# Patient Record
Sex: Male | Born: 1975 | Race: White | Hispanic: No | Marital: Single
Health system: Midwestern US, Community
[De-identification: ages and names within clinical notes are randomized; demographics above are authoritative.]

## PROBLEM LIST (undated history)

## (undated) DIAGNOSIS — A4902 Methicillin resistant Staphylococcus aureus infection, unspecified site: Secondary | ICD-10-CM

## (undated) DIAGNOSIS — I509 Heart failure, unspecified: Secondary | ICD-10-CM

## (undated) HISTORY — PX: LAPAROSCOPIC GASTRIC BANDING: SHX1100

## (undated) HISTORY — PX: LAPAROSCOPIC REPAIR AND REMOVAL OF GASTRIC BAND: SHX5919

## (undated) HISTORY — PX: CHOLECYSTECTOMY: SHX55

---

## 2016-07-21 ENCOUNTER — Inpatient Hospital Stay
Admit: 2016-07-21 | Discharge: 2016-07-24 | Disposition: A | Discharge status: Other Acute Inpatient Hospital | Payer: MEDICARE | Attending: Emergency Medicine

## 2016-07-21 DIAGNOSIS — F329 Major depressive disorder, single episode, unspecified: Secondary | ICD-10-CM

## 2016-07-21 LAB — ETHYL ALCOHOL: ALCOHOL(ETHYL),SERUM: <3 MG/DL (ref 0–3)

## 2016-07-21 LAB — DRUG SCREEN, URINE
AMPHETAMINES: NEGATIVE
BARBITURATES: NEGATIVE
BENZODIAZEPINES: NEGATIVE
COCAINE: NEGATIVE
METHADONE: NEGATIVE
OPIATES: NEGATIVE
PCP(PHENCYCLIDINE): NEGATIVE
THC (TH-CANNABINOL): NEGATIVE

## 2016-07-21 LAB — CBC WITH AUTOMATED DIFF
ABS. BASOPHILS: 0 10*3/uL (ref 0.0–0.06)
ABS. EOSINOPHILS: 0.1 10*3/uL (ref 0.0–0.4)
ABS. LYMPHOCYTES: 2.4 10*3/uL (ref 0.9–3.6)
ABS. MONOCYTES: 0.9 10*3/uL (ref 0.05–1.2)
ABS. NEUTROPHILS: 4.2 10*3/uL (ref 1.8–8.0)
BASOPHILS: 0 % (ref 0–2)
EOSINOPHILS: 2 % (ref 0–5)
HCT: 40.8 % (ref 36.0–48.0)
HGB: 12.9 g/dL — ABNORMAL LOW (ref 13.0–16.0)
LYMPHOCYTES: 31 % (ref 21–52)
MCH: 29.3 PG (ref 24.0–34.0)
MCHC: 31.6 g/dL (ref 31.0–37.0)
MCV: 92.5 FL (ref 74.0–97.0)
MONOCYTES: 12 % — ABNORMAL HIGH (ref 3–10)
MPV: 9.8 FL (ref 9.2–11.8)
NEUTROPHILS: 55 % (ref 40–73)
PLATELET: 218 10*3/uL (ref 135–420)
RBC: 4.41 M/uL — ABNORMAL LOW (ref 4.70–5.50)
RDW: 14.7 % — ABNORMAL HIGH (ref 11.6–14.5)
WBC: 7.6 10*3/uL (ref 4.6–13.2)

## 2016-07-21 LAB — METABOLIC PANEL, BASIC
Anion gap: 10 mmol/L (ref 3.0–18)
BUN/Creatinine ratio: 13 (ref 12–20)
BUN: 10 MG/DL (ref 7.0–18)
CO2: 29 mmol/L (ref 21–32)
Calcium: 8.8 MG/DL (ref 8.5–10.1)
Chloride: 105 mmol/L (ref 100–108)
Creatinine: 0.77 MG/DL (ref 0.6–1.3)
GFR est AA: >60 mL/min/{1.73_m2} (ref >60)
GFR est non-AA: >60 mL/min/{1.73_m2} (ref >60)
Glucose: 83 mg/dL (ref 74–99)
Potassium: 3.6 mmol/L (ref 3.5–5.5)
Sodium: 144 mmol/L (ref 136–145)

## 2016-07-21 LAB — GLUCOSE, POC: Glucose (POC): 92 mg/dL (ref 70–110)

## 2016-07-21 NOTE — Consults (Signed)
Tele-psychiatry consult is done with the help of onsite staff.  Patient location: [facility])  Physician location: [place]    Patient Name: Clarence Rodriguez  Date: 07/21/2016  Time: 4:13 PM   DOB: 1975-11-21    Reason for consult:  SI  History of Present Illness: Clarence Rodriguez is a 41 y.o. male with bipolar depression and PTSD brought in by the police for suicidal ideation with plan to overdose. Patient has been increasingly depressed over the last 2 weeks. Staff report that patient is sleepy, calm, and cooperative in the hospital. On interview, patient had difficulty staying awake but was able to confirm that he is suicidal but decided to get help before following through on the thoughts to overdose. It is unclear if this sedation is a response to his depakote which has apparently been started at his most recent inpatient encounter. Patient denies HI but admits to throwing something at staff when he was last admitted. He denies any current symptoms of psychosis.    SI/HI/Self harm/Violence: current active SI with past overdose; no HI but has thrown things at staff during past hospital encounters    Sources of information: patient, EMR, hospital staff: PA Janit Bern    Psychiatric History/Treatment History: inpatient last month; no current established outpatient     Drug/Alcohol History: UDS: negative  Medical History:   History reviewed. No pertinent past medical history.     Medications & Freq:   Prior to Admission medications    Medication Sig Start Date End Date Taking? Authorizing Provider   divalproex ER (DEPAKOTE ER) 250 mg ER tablet Take  by mouth.   Yes Phys Other, MD   dilTIAZem ER (CARDIZEM LA) 240 mg Tb24 tablet Take 240 mg by mouth daily.   Yes Phys Other, MD   ibuprofen 200 mg cap Take  by mouth.   Yes Phys Other, MD   pregabalin (LYRICA) 100 mg capsule Take 100 mg by mouth two (2) times a day.   Yes Phys Other, MD   metFORMIN (GLUCOPHAGE) 500 mg tablet Take 500 mg by mouth two (2) times  daily (with meals).   Yes Phys Other, MD   montelukast (SINGULAIR) 10 mg tablet Take 10 mg by mouth daily.   Yes Phys Other, MD   cephALEXin (KEFLEX) 500 mg capsule Take 500 mg by mouth four (4) times daily.   Yes Phys Other, MD   lisinopril (PRINIVIL, ZESTRIL) 40 mg tablet Take 40 mg by mouth daily.   Yes Phys Other, MD   cloNIDine HCl (CATAPRES) 0.1 mg tablet Take  by mouth two (2) times a day.   Yes Phys Other, MD   divalproex ER (DEPAKOTE ER) 250 mg ER tablet Take 250 mg by mouth.   Yes Phys Other, MD     Allergies: No Known Allergies  Family Psych History/History of suicide: History reviewed. No pertinent family history.  Social History:    Employment: none, on disability   Living situation: homeless shelter   Stressors: recent move here from [place]   Strengths: accepts help    Mental Status Exam:   Appearance and attire: appropriate for setting  Attitude and behavior: attempts to cooperate with questions but very somnolent  Speech: slowed, slurred  Affect and mood: blunted, "depressed"  Association and thought processes: goal directed, impoverished  Thought content: SI: active plans to overdose; HI: denies  Perception: AVH: none; Delusions: none  Sensorium and orientation: somnolent, mostly oriented to situation   Insight and judgment: fair  Impression/Risk Assessment:   Clarence Rodriguez is a 41 y.o. male with depression who presents to the ED for SI with plans to overdose. Patient is sedated but denies having taken more than is indicated on his prescriptions. No HI or symptoms of psychosis.    Principal Diagnosis: F31.9 Bipolar depression    Treatment Recommendations:  1. Disposition: voluntary for inpatient psychiatry   2. Psychiatric medications: no changes in this setting     The above were discussed with the patient and the referring provider; able parties stated understanding and agreement with the recommendations.    Electronically signed by Aura FeyNelly Delphine Sizemore, M.D.

## 2016-07-21 NOTE — ED Notes (Signed)
3 belongings bag placed in communication room due to size unable to fit in locker. Security took phone and credit car to safe.

## 2016-07-21 NOTE — ED Provider Notes (Signed)
HPI Comments: Patient is a 40 y/o morbidly obese male w/ PMH Depression, HTN, DM, cellulitis who presents to the ER for evaluation of suicidal ideations.  Patient was transported in by [employer] as a voluntary admission.  Patient reports within the past week, his depression has been worsening, and now he is having thoughts of wanting to kill himself.  Patient reports his plan is to overdose on his prescribed medications.  He reports recently started taking Lyrica, and has been taking all of his medications as prescribed.  He denied any recent drug/ETOH use.  He denied any homicidal ideations, auditory or visual hallucinations, and is voluntary at this time.  No other complaints.    Patient is a 41 y.o. male presenting with suicidal ideation. The history is provided by the patient.   Suicidal   Pertinent negatives include no shortness of breath, no chest pain, no vomiting, no headaches and no nausea.        History reviewed. No pertinent past medical history.    History reviewed. No pertinent surgical history.      History reviewed. No pertinent family history.    Social History     Social History   ??? Marital status: SINGLE     Spouse name: N/A   ??? Number of children: N/A   ??? Years of education: N/A     Occupational History   ??? Not on file.     Social History Main Topics   ??? Smoking status: Current Every Day Smoker   ??? Smokeless tobacco: Never Used   ??? Alcohol use No   ??? Drug use: Not on file   ??? Sexual activity: Not on file     Other Topics Concern   ??? Not on file     Social History Narrative   ??? No narrative on file         ALLERGIES: Review of patient's allergies indicates no known allergies.    Review of Systems   Constitutional: Negative for chills, fatigue and fever.   HENT: Negative.  Negative for sore throat.    Eyes: Negative.    Respiratory: Negative for cough and shortness of breath.    Cardiovascular: Negative for chest pain and palpitations.    Gastrointestinal: Negative for abdominal pain, nausea and vomiting.   Genitourinary: Negative for dysuria.   Musculoskeletal: Negative.    Skin: Negative.    Neurological: Negative for dizziness, weakness, light-headedness and headaches.   Psychiatric/Behavioral: Positive for suicidal ideas.   All other systems reviewed and are negative.      Vitals:    07/21/16 1442 07/21/16 1507 07/21/16 2131 07/22/16 0018   BP:  130/82 137/70 129/71   Pulse:  (!) 102 90 100   Resp:  22 21 22    Temp:  98.4 ??F (36.9 ??C) 97.7 ??F (36.5 ??C) 97.7 ??F (36.5 ??C)   SpO2:  97% 98% 98%   Weight: (!) 179.6 kg (396 lb)      Height: 5' 11"  (1.803 m)               Physical Exam   Constitutional: He is oriented to person, place, and time. He appears well-developed and well-nourished. No distress.   Morbidly obese, sleepy but easily aroused by voice   HENT:   Head: Normocephalic and atraumatic.   Mouth/Throat: Oropharynx is clear and moist.   Eyes: Conjunctivae are normal. No scleral icterus.   Neck: Neck supple. No JVD present. No tracheal deviation present.   Cardiovascular:  Regular rhythm and normal heart sounds.  Tachycardia present.    Pulmonary/Chest: Effort normal and breath sounds normal. No respiratory distress. He has no wheezes.   Abdominal: Soft. There is no tenderness.   Musculoskeletal: Normal range of motion.   Neurological: He is alert and oriented to person, place, and time. He has normal strength. Gait normal. GCS eye subscore is 4. GCS verbal subscore is 5. GCS motor subscore is 6.   Skin: Skin is warm and dry. He is not diaphoretic.   Psychiatric: Judgment normal. His speech is slurred. He is not agitated and not aggressive. Thought content is not paranoid and not delusional. Cognition and memory are normal. He exhibits a depressed mood. He expresses suicidal ideation. He expresses no homicidal ideation. He expresses suicidal plans. He expresses no homicidal plans.    States having SI, plan is to overdose by taking too much of his medications   Nursing note and vitals reviewed.       MDM  Number of Diagnoses or Management Options  Depression, unspecified depression type:   Suicidal ideations:   Diagnosis management comments: 3:46 PM  41 y/o male brought in the ER for SI by [employer] as voluntary patient.  History of depression in the past.  States taking his meds as prescribed.  Reports that within the past 1-2 weeks worsening depression, and suicidal ideations.  Plan is to take too many of his medications to end his life.  Pt denied any drugs/ETOH on exam.  Will plan on labs, UDS and telepsych eval.  Randolm Idol, PA-C    4:40 PM  Per telepsych, recommends inpatient stabilization.  Hold all medications due to pts somnolence.  Called and spoke with Butch Penny, at [facility] behavioral.  She will add the pt to their list and evaluate pts situation.  Randolm Idol, PA-C    8:34 PM  Butch Penny from [facility] called back regarding pt.  States they cannot accept his admission at this time.  Patient noted to be almost 400 lbs, and their beds in psych can only hold patients up to 350 pounds.  Bariatric beds do not fit through the doorways at their facility.  Patient will need to stay here tonight and await further placement tomorrow morning by staff.  Dr. Veneta Penton made aware of situation.  Randolm Idol, PA-C    2:07 AM : Pt care transferred to Dr. Jetty Duhamel provider. History of patient complaint(s), available diagnostic reports and current treatment plan has been discussed thoroughly.   Intended disposition of patient : Pending placement for voluntary admission of SI  Pending diagnostics reports and/or labs (please list): None  Randolm Idol, PA-C            Clinical Impression:  Suicidal Ideations, depression       Amount and/or Complexity of Data Reviewed  Clinical lab tests: ordered and reviewed    Risk of Complications, Morbidity, and/or Mortality  Presenting problems: moderate   Diagnostic procedures: moderate  Management options: moderate    Patient Progress  Patient progress: stable        ED Course       Procedures    I am the first and primary provider of this patient.  Randolm Idol, PA-C         Vitals:  Patient Vitals for the past 12 hrs:   Temp Pulse Resp BP SpO2   07/22/16 0018 97.7 ??F (36.5 ??C) 100 22 129/71 98 %   07/21/16 2131 97.7 ??F (  36.5 ??C) 90 21 137/70 98 %   07/21/16 1507 98.4 ??F (36.9 ??C) (!) 102 22 130/82 97 %         Medications ordered:   Medications   ibuprofen (MOTRIN) tablet 800 mg (800 mg Oral Given 07/22/16 0028)         Lab findings:  Recent Results (from the past 12 hour(s))   DRUG SCREEN, URINE    Collection Time: 07/21/16  3:20 PM   Result Value Ref Range    BENZODIAZEPINES NEGATIVE  NEG      BARBITURATES NEGATIVE  NEG      THC (TH-CANNABINOL) NEGATIVE  NEG      OPIATES NEGATIVE  NEG      PCP(PHENCYCLIDINE) NEGATIVE  NEG      COCAINE NEGATIVE  NEG      AMPHETAMINES NEGATIVE  NEG      METHADONE NEGATIVE  NEG      HDSCOM (NOTE)    ETHYL ALCOHOL    Collection Time: 07/21/16  3:30 PM   Result Value Ref Range    ALCOHOL(ETHYL),SERUM <3 0 - 3 MG/DL   CBC WITH AUTOMATED DIFF    Collection Time: 07/21/16  3:30 PM   Result Value Ref Range    WBC 7.6 4.6 - 13.2 K/uL    RBC 4.41 (L) 4.70 - 5.50 M/uL    HGB 12.9 (L) 13.0 - 16.0 g/dL    HCT 40.8 36.0 - 48.0 %    MCV 92.5 74.0 - 97.0 FL    MCH 29.3 24.0 - 34.0 PG    MCHC 31.6 31.0 - 37.0 g/dL    RDW 14.7 (H) 11.6 - 14.5 %    PLATELET 218 135 - 420 K/uL    MPV 9.8 9.2 - 11.8 FL    NEUTROPHILS 55 40 - 73 %    LYMPHOCYTES 31 21 - 52 %    MONOCYTES 12 (H) 3 - 10 %    EOSINOPHILS 2 0 - 5 %    BASOPHILS 0 0 - 2 %    ABS. NEUTROPHILS 4.2 1.8 - 8.0 K/UL    ABS. LYMPHOCYTES 2.4 0.9 - 3.6 K/UL    ABS. MONOCYTES 0.9 0.05 - 1.2 K/UL    ABS. EOSINOPHILS 0.1 0.0 - 0.4 K/UL    ABS. BASOPHILS 0.0 0.0 - 0.06 K/UL    DF AUTOMATED     METABOLIC PANEL, BASIC    Collection Time: 07/21/16  3:30 PM   Result Value Ref Range     Sodium 144 136 - 145 mmol/L    Potassium 3.6 3.5 - 5.5 mmol/L    Chloride 105 100 - 108 mmol/L    CO2 29 21 - 32 mmol/L    Anion gap 10 3.0 - 18 mmol/L    Glucose 83 74 - 99 mg/dL    BUN 10 7.0 - 18 MG/DL    Creatinine 0.77 0.6 - 1.3 MG/DL    BUN/Creatinine ratio 13 12 - 20      GFR est AA >60 >60 ml/min/1.52m    GFR est non-AA >60 >60 ml/min/1.755m   Calcium 8.8 8.5 - 10.1 MG/DL   GLUCOSE, POC    Collection Time: 07/21/16  3:39 PM   Result Value Ref Range    Glucose (POC) 92 70 - 110 mg/dL       EKG interpretation by ED Physician:      X-Ray, CT or other radiology findings or impressions:  No orders to display  Progress notes, Consult notes or additional Procedure notes:       Reevaluation of patient:       Disposition:  Diagnosis:   1. Suicidal ideations    2. Depression, unspecified depression type        Disposition: Pending voluntary admission for SI    Follow-up Information     None           Patient's Medications   Start Taking    No medications on file   Continue Taking    CEPHALEXIN (KEFLEX) 500 MG CAPSULE    Take 500 mg by mouth four (4) times daily.    CLONIDINE HCL (CATAPRES) 0.1 MG TABLET    Take  by mouth two (2) times a day.    DILTIAZEM ER (CARDIZEM LA) 240 MG TB24 TABLET    Take 240 mg by mouth daily.    DIVALPROEX ER (DEPAKOTE ER) 250 MG ER TABLET    Take  by mouth.    DIVALPROEX ER (DEPAKOTE ER) 250 MG ER TABLET    Take 250 mg by mouth.    IBUPROFEN 200 MG CAP    Take  by mouth.    LISINOPRIL (PRINIVIL, ZESTRIL) 40 MG TABLET    Take 40 mg by mouth daily.    METFORMIN (GLUCOPHAGE) 500 MG TABLET    Take 500 mg by mouth two (2) times daily (with meals).    MONTELUKAST (SINGULAIR) 10 MG TABLET    Take 10 mg by mouth daily.    PREGABALIN (LYRICA) 100 MG CAPSULE    Take 100 mg by mouth two (2) times a day.   These Medications have changed    No medications on file   Stop Taking    No medications on file

## 2016-07-21 NOTE — ED Triage Notes (Signed)
Pt brought by [employer], c/o suicidal.

## 2016-07-21 NOTE — ED Notes (Signed)
Sitter at patients bedside completing q 15 minute checks on paper form.

## 2016-07-21 NOTE — Consults (Signed)
Tele-psychiatry consult is done with the help of onsite staff.  [facility]  [place]    Patient Name: Clarence Rodriguez  Date: 07/21/2016  Time: 4:13 PM   DOB: Dec 21, 1975    Reason for consult:  SI  History of Present Illness: Clarence Rodriguez is a 41 y.o. male with bipolar depression and PTSD brought in by the police for suicidal ideation with plan to overdose. Patient has been increasingly depressed over the last 2 weeks. Staff report that patient is sleepy, calm, and cooperative in the hospital. On interview, patient had difficulty staying awake but was able to confirm that he is suicidal but decided to get help before following through on the thoughts to overdose. It is unclear if this sedation is a response to his depakote which has apparently been started at his most recent inpatient encounter. Patient denies HI but admits to throwing something at staff when he was last admitted. He denies any current symptoms of psychosis.    SI/HI/Self harm/Violence: current active SI with past overdose; no HI but has thrown things at staff during past hospital encounters    Sources of information: patient, EMR, hospital staff: PA Janit BernStacy Forbes    Psychiatric History/Treatment History: inpatient last month; no current established outpatient     Drug/Alcohol History: UDS: negative  Medical History:   History reviewed. No pertinent past medical history.     Medications & Freq:   Prior to Admission medications    Medication Sig Start Date End Date Taking? Authorizing Provider   divalproex ER (DEPAKOTE ER) 250 mg ER tablet Take  by mouth.   Yes Phys Other, MD   dilTIAZem ER (CARDIZEM LA) 240 mg Tb24 tablet Take 240 mg by mouth daily.   Yes Phys Other, MD   ibuprofen 200 mg cap Take  by mouth.   Yes Phys Other, MD   pregabalin (LYRICA) 100 mg capsule Take 100 mg by mouth two (2) times a day.   Yes Phys Other, MD   metFORMIN (GLUCOPHAGE) 500 mg tablet Take 500 mg by mouth two (2) times daily (with  meals).   Yes Phys Other, MD   montelukast (SINGULAIR) 10 mg tablet Take 10 mg by mouth daily.   Yes Phys Other, MD   cephALEXin (KEFLEX) 500 mg capsule Take 500 mg by mouth four (4) times daily.   Yes Phys Other, MD   lisinopril (PRINIVIL, ZESTRIL) 40 mg tablet Take 40 mg by mouth daily.   Yes Phys Other, MD   cloNIDine HCl (CATAPRES) 0.1 mg tablet Take  by mouth two (2) times a day.   Yes Phys Other, MD   divalproex ER (DEPAKOTE ER) 250 mg ER tablet Take 250 mg by mouth.   Yes Phys Other, MD     Allergies: No Known Allergies  Family Psych History/History of suicide: History reviewed. No pertinent family history.  Social History:    Employment: none, on disability   Living situation: homeless shelter   Stressors: recent move here from [place]   Strengths: accepts help    Mental Status Exam:   Appearance and attire: appropriate for setting  Attitude and behavior: attempts to cooperate with questions but very somnolent  Speech: slowed, slurred  Affect and mood: blunted, "depressed"  Association and thought processes: goal directed, impoverished  Thought content: SI: active plans to overdose; HI: denies  Perception: AVH: none; Delusions: none  Sensorium and orientation: somnolent, mostly oriented to situation   Insight and judgment: fair  Impression/Risk Assessment:   Clarence Rodriguez is a 41 y.o. male with depression who presents to the ED for SI with plans to overdose. Patient is sedated but denies having taken more than is indicated on his prescriptions. No HI or symptoms of psychosis.    Principal Diagnosis: F31.9 Bipolar depression    Treatment Recommendations:  1. Disposition: voluntary for inpatient psychiatry   2. Psychiatric medications: no changes in this setting     The above were discussed with the patient and the referring provider; able parties stated understanding and agreement with the recommendations.    Electronically signed by Aura FeyNelly Delphine Sizemore, M.D.

## 2016-07-21 NOTE — ED Notes (Signed)
Patient place in gown, unable to fit in paper scrubs.

## 2016-07-21 NOTE — ED Notes (Signed)
Report given to Joyce Hoven, RN.

## 2016-07-22 LAB — GLUCOSE, POC: Glucose (POC): 95 mg/dL (ref 70–110)

## 2016-07-22 MED ORDER — DIPHENHYDRAMINE 50 MG CAP
50 mg | ORAL | Status: AC
Start: 2016-07-22 — End: 2016-07-22
  Administered 2016-07-22: 21:00:00 via ORAL

## 2016-07-22 MED ORDER — IBUPROFEN 400 MG TAB
400 mg | ORAL | Status: AC
Start: 2016-07-22 — End: 2016-07-22
  Administered 2016-07-22: 05:00:00 via ORAL

## 2016-07-22 MED ORDER — METFORMIN 500 MG TAB
500 mg | Freq: Two times a day (BID) | ORAL | Status: DC
Start: 2016-07-22 — End: 2016-07-24
  Administered 2016-07-22 – 2016-07-24 (×5): via ORAL

## 2016-07-22 MED ORDER — DIPHENHYDRAMINE HCL 50 MG/ML IJ SOLN
50 mg/mL | Freq: Once | INTRAMUSCULAR | Status: DC
Start: 2016-07-22 — End: 2016-07-22

## 2016-07-22 MED ORDER — WATER FOR INJECTION, STERILE INJECTION
20. mg/mL (final conc.) | Freq: Once | INTRAMUSCULAR | Status: AC
Start: 2016-07-22 — End: 2016-07-23

## 2016-07-22 MED ORDER — DILTIAZEM ER 240 MG 24 HR CAP
240 mg | Freq: Every day | ORAL | Status: DC
Start: 2016-07-22 — End: 2016-07-24
  Administered 2016-07-22 – 2016-07-24 (×3): via ORAL

## 2016-07-22 MED ORDER — CLONIDINE 0.1 MG TAB
0.1 mg | Freq: Two times a day (BID) | ORAL | Status: DC
Start: 2016-07-22 — End: 2016-07-24
  Administered 2016-07-22 – 2016-07-24 (×5): via ORAL

## 2016-07-22 MED ORDER — DIPHENHYDRAMINE 50 MG CAP
50 mg | ORAL | Status: DC
Start: 2016-07-22 — End: 2016-07-22

## 2016-07-22 MED ORDER — LISINOPRIL 20 MG TAB
20 mg | ORAL | Status: AC
Start: 2016-07-22 — End: 2016-07-22
  Administered 2016-07-22: 17:00:00 via ORAL

## 2016-07-22 MED ORDER — CEPHALEXIN 250 MG CAP
250 mg | Freq: Four times a day (QID) | ORAL | Status: DC
Start: 2016-07-22 — End: 2016-07-24
  Administered 2016-07-22 – 2016-07-24 (×7): via ORAL

## 2016-07-22 MED ORDER — DIVALPROEX  250 MG 24 HR TAB
250 mg | Freq: Every day | ORAL | Status: DC
Start: 2016-07-22 — End: 2016-07-24
  Administered 2016-07-22 – 2016-07-24 (×3): via ORAL

## 2016-07-22 MED ORDER — PREGABALIN 50 MG CAP
50 mg | Freq: Two times a day (BID) | ORAL | Status: DC
Start: 2016-07-22 — End: 2016-07-24
  Administered 2016-07-22 – 2016-07-24 (×5): via ORAL

## 2016-07-22 MED FILL — CLONIDINE 0.1 MG TAB: 0.1 mg | ORAL | Qty: 1

## 2016-07-22 MED FILL — LYRICA 50 MG CAPSULE: 50 mg | ORAL | Qty: 2

## 2016-07-22 MED FILL — DILTIAZEM ER 240 MG 24 HR CAP: 240 mg | ORAL | Qty: 1

## 2016-07-22 MED FILL — DIPHENHYDRAMINE HCL 50 MG/ML IJ SOLN: 50 mg/mL | INTRAMUSCULAR | Qty: 1

## 2016-07-22 MED FILL — METFORMIN 500 MG TAB: 500 mg | ORAL | Qty: 1

## 2016-07-22 MED FILL — CEPHALEXIN 250 MG CAP: 250 mg | ORAL | Qty: 2

## 2016-07-22 MED FILL — LISINOPRIL 20 MG TAB: 20 mg | ORAL | Qty: 2

## 2016-07-22 MED FILL — DIPHENHYDRAMINE 50 MG CAP: 50 mg | ORAL | Qty: 1

## 2016-07-22 MED FILL — GEODON 20 MG/ML (FINAL CONCENTRATION) INTRAMUSCULAR SOLUTION: 20 mg/mL (final conc.) | INTRAMUSCULAR | Qty: 20

## 2016-07-22 MED FILL — IBUPROFEN 400 MG TAB: 400 mg | ORAL | Qty: 2

## 2016-07-22 MED FILL — DIVALPROEX  250 MG 24 HR TAB: 250 mg | ORAL | Qty: 1

## 2016-07-22 NOTE — ED Notes (Signed)
Pt given lunch tray

## 2016-07-22 NOTE — ED Notes (Signed)
Verbal shift change report given to Jordan,RN (Cabin crewoncoming nurse) by Suan Halterharmaine, RN (offgoing nurse). Report included the following information SBAR, ED Summary and MAR.

## 2016-07-22 NOTE — ED Notes (Signed)
I was personally available for consultation in the emergency department as.  I have reviewed the chart prior to the patient being discharged and agree with the documentation [facility], including the assessment, treatment plan, and disposition.  Candie Echevariaraig Knox Holdman, MD    Signed out pending placement suicidal ideation voluntary      [facility] too [facility] to be admitted to their facility    It is now the end of my shift, I am still awaiting placement.  Patient will be signed out to the oncoming physician Dr. Dolores FrameGuerra at 0700.    Disposition:    Pending      Portions of this chart were created with Dragon medical speech to text program.   Unrecognized errors may be present.

## 2016-07-22 NOTE — ED Notes (Signed)
Pt asking for clothes and threatening to leave.  Pt states "call the [facility] abd TDO me because they can do a better job finding a bed for me than you can". [facility] called and spoke with Belenda CruiseKristin.

## 2016-07-22 NOTE — ED Notes (Signed)
Report received from Charmaine, RN

## 2016-07-22 NOTE — ED Notes (Addendum)
Pt throw table at Dr. Dolores FrameGuerra and threatening to leave

## 2016-07-22 NOTE — Consults (Signed)
Chief Complaint: Telepsychiatry follow up consultation   Location of patient: [facility]   Location of doctor: [place]   This evaluation was conducted via Telepsychiatry with the assistance of onsite staff.   History of Present Illness: This pt is a 41 yr old male who came to the ED voluntarily yesterday with compliant of depression and SI with plan to overdose. Pt was seen by Telepsychiatry yesterday, please see that note for more details. At that time, plan was for the pt to be admitted to inpt psychiatry voluntarily. Pt has been awaiting placement but this has been challenging due to his morbid obesity.     I spoke with the ED physician today and the pt is now requesting discharge and is denying suicidal thoughts. Telepsychiatry was consulted for a follow up assessment. During my interview pt is somewhat guarded. He says that he has been feeling depressed and suicidal for around 1 month in the context of homelessness. He reports that yesterday he came to the hospital with SI with plan to overdose. He reports hx of suicide attempt via overdose years ago. He tells me that he still feels suicidal. He has no current outpt care. He has limited social supports. He reports that he feels that he has nothing to live for.  He reports that he would still like to be admitted to inpt psychiatry but does not wish to wait any longer.     Collateral: see HPI   Mental Status Exam:   Appearance and attire: hospital attire, lying in bed   Attitude and behavior: very guarded   Speech: clear, coherent   Affect and mood: depressed   Association and thought processes: goal directed   Thought content: +SI   Perception: pt does not appear to be responding to internal stimuli.   Sensorium, memory, and orientation: Alert   Intellectual functioning: unable to assess   Insight and judgment: impaired     Diagnosis:   Unspecified depressive disorder     Impression/Risk Assessment/Treatment Recommendations:   -Recommended level of care:    The patient is a 41 yr old male who came to the ED yesterday with SI and depression . He was initially voluntary for inpt psychiatric treatment and is pending placement , but per ED team the pt has been requesting discharge as of today. During my assessment he continues to endorse SI. He says that he is willing to go inpt but does not want to wait any longer. The pt has muitple risk factors for suicide including hx of suicide attempt, limited social supports, no outpt care, homelessness and pt also reports that he has nothing to live for.       The patient is thus an acute danger to self and continues to require inpatient psychiatric hospitalization for stabilization and treatment.     If the pt continues to refuse voluntary inpt admission then [facility] as he is high risk and a potential danger to self.     Case discussed with ED team.     Suraya Kawadry MD  Telepsychiatry

## 2016-07-22 NOTE — ED Notes (Signed)
[place] [employer] at bedside

## 2016-07-22 NOTE — Progress Notes (Signed)
Called [facility] to pre screen pt for CSU and spoke with Shantell. She states that this writer needs to explain to pt what is CSU before coming to see pt.

## 2016-07-22 NOTE — Consults (Signed)
Tele-psychiatry Phone Consult    Briefly discussed with Dr Guerra this 41 yo male seen yesterday and earlier today. He was recommended for inpatient psychiatry both times. Patient threw something at staff and required IM geodon. He is also back on outpatient mood stabilizing regimen. Recommendations include IM benadryl and IM ativan if additional prn needed acutely. Agree with geodon and may repeat in 4h if needed with dose to not exceed 40mg in 24h.

## 2016-07-22 NOTE — Progress Notes (Signed)
[facility] and spoke with [place].

## 2016-07-22 NOTE — ED Notes (Signed)
[facility] at bedside

## 2016-07-22 NOTE — Progress Notes (Signed)
[facility] lbs.

## 2016-07-22 NOTE — Progress Notes (Signed)
Clarence Rodriguez, Clarence Rodriguez updated with pt condition and states that they don't have a TDO bed today but possibly tomorrow.

## 2016-07-22 NOTE — ED Notes (Signed)
This patient has been recommended for inpatient treatment and is awaiting placement.  The ED provider has reviewed the patient???s history and medications, diet, and treatments and will order as necessary. The patient will be observed and attended to as their medical needs require and/or policy dictates

## 2016-07-22 NOTE — ED Notes (Signed)
Pt given ham sandwich only

## 2016-07-22 NOTE — ED Notes (Signed)
Verbal shift change report given to Laura (oncoming nurse) by Charmaine (offgoing nurse). Report included the following information SBAR, ED Summary and MAR.
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
## 2016-07-22 NOTE — Consults (Signed)
Chief Complaint: Telepsychiatry follow up consultation   [facility]   [place]   This evaluation was conducted via Telepsychiatry with the assistance of onsite staff.   History of Present Illness: This pt is a 41 yr old male who came to the ED voluntarily yesterday with compliant of depression and SI with plan to overdose. Pt was seen by Telepsychiatry yesterday, please see that note for more details. At that time, plan was for the pt to be admitted to inpt psychiatry voluntarily. Pt has been awaiting placement but this has been challenging due to his morbid obesity.     I spoke with the ED physician today and the pt is now requesting discharge and is denying suicidal thoughts. Telepsychiatry was consulted for a follow up assessment. During my interview pt is somewhat guarded. He says that he has been feeling depressed and suicidal for around 1 month in the context of homelessness. He reports that yesterday he came to the hospital with SI with plan to overdose. He reports hx of suicide attempt via overdose years ago. He tells me that he still feels suicidal. He has no current outpt care. He has limited social supports. He reports that he feels that he has nothing to live for.  He reports that he would still like to be admitted to inpt psychiatry but does not wish to wait any longer.     Collateral: see HPI   Mental Status Exam:   Appearance and attire: hospital attire, lying in bed   Attitude and behavior: very guarded   Speech: clear, coherent   Affect and mood: depressed   Association and thought processes: goal directed   Thought content: +SI   Perception: pt does not appear to be responding to internal stimuli.   Sensorium, memory, and orientation: Alert   Intellectual functioning: unable to assess   Insight and judgment: impaired     Diagnosis:   Unspecified depressive disorder     Impression/Risk Assessment/Treatment Recommendations:   -Recommended level of care:    The patient is a 41 yr old male who came to the ED yesterday with SI and depression . He was initially voluntary for inpt psychiatric treatment and is pending placement , but per ED team the pt has been requesting discharge as of today. During my assessment he continues to endorse SI. He says that he is willing to go inpt but does not want to wait any longer. The pt has muitple risk factors for suicide including hx of suicide attempt, limited social supports, no outpt care, homelessness and pt also reports that he has nothing to live for.       The patient is thus an acute danger to self and continues to require inpatient psychiatric hospitalization for stabilization and treatment.     If the pt continues to refuse voluntary inpt admission then he should be screened by the [facility] as he is high risk and a potential danger to self.     Case discussed with ED team.     Gerlene BurdockSuraya Kawadry MD  Telepsychiatry
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
## 2016-07-22 NOTE — Progress Notes (Addendum)
[facility] Called [facility] and [facility] available. [facility] weight capacity is <350 lbs. [facility] nurse paged. Ina Kickasha, PERS nurse, called back and and states bed capacity is 350 lbs.

## 2016-07-22 NOTE — Progress Notes (Signed)
Met with pt at bedside. Explained to pt that [facility] declined him due to weight bed capacity. Pt states he wants to go home now and doesn't want to go through admissions. He also states that he does have insurance, [employer], but has not received his insurance card. He states that he used to have [employer] but changed to [employer] effective 07/18/2016. Spoke with registration and chart updated. ED MD made aware that pt wanted to leave and tele psych consult ordered.

## 2016-07-23 MED ORDER — ACETAMINOPHEN 325 MG TABLET
325 mg | ORAL | Status: AC
Start: 2016-07-23 — End: 2016-07-23
  Administered 2016-07-23: 12:00:00 via ORAL

## 2016-07-23 MED ORDER — LORAZEPAM 1 MG TAB
1 mg | ORAL | Status: AC
Start: 2016-07-23 — End: 2016-07-23
  Administered 2016-07-23: 21:00:00 via ORAL

## 2016-07-23 MED FILL — LYRICA 50 MG CAPSULE: 50 mg | ORAL | Qty: 2

## 2016-07-23 MED FILL — DILTIAZEM ER 240 MG 24 HR CAP: 240 mg | ORAL | Qty: 1

## 2016-07-23 MED FILL — TYLENOL 325 MG TABLET: 325 mg | ORAL | Qty: 2

## 2016-07-23 MED FILL — CLONIDINE 0.1 MG TAB: 0.1 mg | ORAL | Qty: 1

## 2016-07-23 MED FILL — LORAZEPAM 1 MG TAB: 1 mg | ORAL | Qty: 1

## 2016-07-23 MED FILL — METFORMIN 500 MG TAB: 500 mg | ORAL | Qty: 1

## 2016-07-23 MED FILL — CEPHALEXIN 250 MG CAP: 250 mg | ORAL | Qty: 2

## 2016-07-23 MED FILL — DIVALPROEX  250 MG 24 HR TAB: 250 mg | ORAL | Qty: 1

## 2016-07-23 NOTE — ED Notes (Signed)
Patient sleeping, will continue to monitor
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
## 2016-07-23 NOTE — ED Notes (Signed)
1900: I assumed care of this patient from Dr. Dolores FrameGuerra.  Patient presented with suicidal ideation, was medically cleared and evaluated by psychiatry.  Signed out pending placement    It is now the end of my shift, I am still awaiting placement.  Patient will be signed out to the oncoming physician Dr. Thedore MinsSingh at 0700.    Disposition:    Pending      Portions of this chart were created with Dragon medical speech to text program.   Unrecognized errors may be present.

## 2016-07-23 NOTE — ED Notes (Signed)
Report to Kristen, RN

## 2016-07-23 NOTE — ED Notes (Signed)
Patient given boxed lunch

## 2016-07-23 NOTE — ED Notes (Signed)
Per Dr Thedore MinsSingh, pt to be TDO.

## 2016-07-23 NOTE — ED Notes (Signed)
Pt provided with breakfast tray. Pt consumed 100% of breakfast tray.

## 2016-07-23 NOTE — ED Notes (Signed)
Bedside shift report completed with VB,RN  Safety measures were reviewed  Activity level, PO status and vital sign trends reviewed   Patient and or family did not participate in handoff   Clinical quality measures reviewed

## 2016-07-23 NOTE — Progress Notes (Addendum)
[facility] Dr Janee Mornhompson called back and states no bed.

## 2016-07-23 NOTE — ED Notes (Signed)
Patient bathing himself at bedside, patient given band-aid for small scab he removed on his leg accidentally with washcloth

## 2016-07-23 NOTE — ED Notes (Signed)
07:20 :Pt care assumed from Dr. Sherian MaroonSharkey, ED provider. Pt complaint(s), current treatment plan, progression and available diagnostic results have been discussed thoroughly.  Rounding occurred: yes  Intended Disposition: Transfer   Pending diagnostic reports and/or labs (please list): Placement    09:30 Informed by Nurse that pt wishes to be discharged and is no longer feeling suicidal. Will place telepsych re-evaluation consult.    11:09 Consult:  Discussed care with Dr. Evie LacksiRienzo (Telepsychiatry). Standard discussion; including history of patient???s chief complaint, available diagnostic results, and treatment course.     7:07 PM : Pt care transferred to Dr. Smith Robertao ,ED provider. History of patient complaint(s), available diagnostic reports and current treatment plan has been discussed thoroughly.   Bedside rounding on patient occured : yes .  Intended disposition of patient : Transfer  Pending diagnostics reports and/or labs (please list): location for placement      Scribe Attestation     London PepperCaroline Benz acting as a scribe for and in the presence of Donzetta SprungAmardeep Othmar Ringer, MD      July 23, 2016 at 1:03 PM       Provider Attestation:      I personally performed the services described in the documentation, reviewed the documentation, as recorded by the scribe in my presence, and it accurately and completely records my words and actions. July 23, 2016 at 1:03 PM - Donzetta SprungAmardeep Luree Palla, MD
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
## 2016-07-23 NOTE — ED Notes (Signed)
[facility] at bedside

## 2016-07-23 NOTE — ED Notes (Signed)
Pt requesting telepsych reevaluation. MD made aware.

## 2016-07-23 NOTE — Progress Notes (Signed)
Seen by [facility] this morning and pt meets criteria for a TDO. [facility] will seek for a bed.

## 2016-07-23 NOTE — Progress Notes (Signed)
[facility], spoke with BergholzDenise.
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
## 2016-07-24 ENCOUNTER — Inpatient Hospital Stay
Admit: 2016-07-24 | Discharge: 2016-07-29 | Disposition: A | Discharge status: Home / Self Care | Payer: MEDICARE | Source: Other Acute Inpatient Hospital | Attending: Psychiatry | Admitting: Psychiatry

## 2016-07-24 DIAGNOSIS — F319 Bipolar disorder, unspecified: Secondary | ICD-10-CM

## 2016-07-24 LAB — EKG 12-LEAD
Atrial Rate: 89 {beats}/min
Diagnosis: NORMAL
P Axis: 31 degrees
P-R Interval: 144 ms
Q-T Interval: 380 ms
QRS Duration: 90 ms
QTc Calculation (Bazett): 462 ms
R Axis: 26 degrees
T Axis: 33 degrees
Ventricular Rate: 89 {beats}/min

## 2016-07-24 LAB — EKG, 12 LEAD, INITIAL
Atrial Rate: 89 {beats}/min
Calculated P Axis: 31 degrees
Calculated R Axis: 26 degrees
Calculated T Axis: 33 degrees
Diagnosis: NORMAL
P-R Interval: 144 ms
Q-T Interval: 380 ms
QRS Duration: 90 ms
QTC Calculation (Bezet): 462 ms
Ventricular Rate: 89 {beats}/min

## 2016-07-24 MED ORDER — OLANZAPINE 5 MG TAB
5 mg | Freq: Four times a day (QID) | ORAL | Status: DC | PRN
Start: 2016-07-24 — End: 2016-07-29

## 2016-07-24 MED ORDER — LORAZEPAM 2 MG/ML IJ SOLN
2 mg/mL | INTRAMUSCULAR | Status: DC | PRN
Start: 2016-07-24 — End: 2016-07-29

## 2016-07-24 MED ORDER — CLONIDINE 0.1 MG TAB
0.1 mg | Freq: Two times a day (BID) | ORAL | Status: DC
Start: 2016-07-24 — End: 2016-07-29
  Administered 2016-07-25 – 2016-07-29 (×9): via ORAL

## 2016-07-24 MED ORDER — ZOLPIDEM 10 MG TAB
10 mg | Freq: Every evening | ORAL | Status: DC | PRN
Start: 2016-07-24 — End: 2016-07-29
  Administered 2016-07-27 – 2016-07-29 (×3): via ORAL

## 2016-07-24 MED ORDER — INSULIN LISPRO 100 UNIT/ML INJECTION
100 unit/mL | Freq: Two times a day (BID) | SUBCUTANEOUS | Status: DC
Start: 2016-07-24 — End: 2016-07-29
  Administered 2016-07-27: 22:00:00 via SUBCUTANEOUS

## 2016-07-24 MED ORDER — MUPIROCIN 2 % OINTMENT
2 % | Freq: Two times a day (BID) | CUTANEOUS | Status: DC
Start: 2016-07-24 — End: 2016-07-29
  Administered 2016-07-25 – 2016-07-29 (×9): via TOPICAL

## 2016-07-24 MED ORDER — HYDROCHLOROTHIAZIDE 25 MG TAB
25 mg | Freq: Every day | ORAL | Status: DC
Start: 2016-07-24 — End: 2016-07-29
  Administered 2016-07-25 – 2016-07-29 (×5): via ORAL

## 2016-07-24 MED ORDER — DEXTROSE 50% IN WATER (D50W) IV SYRG
INTRAVENOUS | Status: DC | PRN
Start: 2016-07-24 — End: 2016-07-29

## 2016-07-24 MED ORDER — MONTELUKAST 10 MG TAB
10 mg | Freq: Every evening | ORAL | Status: DC
Start: 2016-07-24 — End: 2016-07-29
  Administered 2016-07-25 – 2016-07-29 (×5): via ORAL

## 2016-07-24 MED ORDER — ACETAMINOPHEN 325 MG TABLET
325 mg | ORAL | Status: DC | PRN
Start: 2016-07-24 — End: 2016-07-29
  Administered 2016-07-28 – 2016-07-29 (×2): via ORAL

## 2016-07-24 MED ORDER — PREGABALIN 100 MG CAP
100 mg | Freq: Two times a day (BID) | ORAL | Status: DC
Start: 2016-07-24 — End: 2016-07-29
  Administered 2016-07-25 – 2016-07-29 (×11): via ORAL

## 2016-07-24 MED ORDER — DILTIAZEM ER 240 MG 24 HR CAP
240 mg | Freq: Every day | ORAL | Status: DC
Start: 2016-07-24 — End: 2016-07-29
  Administered 2016-07-25 – 2016-07-29 (×5): via ORAL

## 2016-07-24 MED ORDER — NICOTINE 21 MG/24 HR DAILY PATCH
21 mg/24 hr | Freq: Every day | TRANSDERMAL | Status: DC | PRN
Start: 2016-07-24 — End: 2016-07-29

## 2016-07-24 MED ORDER — DIVALPROEX 500 MG 24 HR TAB
500 mg | Freq: Two times a day (BID) | ORAL | Status: DC
Start: 2016-07-24 — End: 2016-07-25
  Administered 2016-07-25 (×2): via ORAL

## 2016-07-24 MED ORDER — METFORMIN SR 500 MG 24 HR TABLET
500 mg | Freq: Every day | ORAL | Status: DC
Start: 2016-07-24 — End: 2016-07-29
  Administered 2016-07-25 – 2016-07-29 (×5): via ORAL

## 2016-07-24 MED ORDER — GLUCAGON 1 MG INJECTION
1 mg | INTRAMUSCULAR | Status: DC | PRN
Start: 2016-07-24 — End: 2016-07-29

## 2016-07-24 MED ORDER — GLUCOSE 4 GRAM CHEWABLE TAB
4 gram | ORAL | Status: DC | PRN
Start: 2016-07-24 — End: 2016-07-29

## 2016-07-24 MED ORDER — MAGNESIUM HYDROXIDE 400 MG/5 ML ORAL SUSP
400 mg/5 mL | Freq: Every day | ORAL | Status: DC | PRN
Start: 2016-07-24 — End: 2016-07-29

## 2016-07-24 MED ORDER — LORAZEPAM 1 MG TAB
1 mg | ORAL | Status: DC | PRN
Start: 2016-07-24 — End: 2016-07-29
  Administered 2016-07-25: 17:00:00 via ORAL

## 2016-07-24 MED ORDER — IBUPROFEN 400 MG TAB
400 mg | Freq: Three times a day (TID) | ORAL | Status: DC | PRN
Start: 2016-07-24 — End: 2016-07-29

## 2016-07-24 MED ORDER — LISINOPRIL 20 MG TAB
20 mg | Freq: Every day | ORAL | Status: DC
Start: 2016-07-24 — End: 2016-07-29
  Administered 2016-07-25 – 2016-07-29 (×5): via ORAL

## 2016-07-24 MED ORDER — BENZTROPINE 2 MG TAB
2 mg | Freq: Two times a day (BID) | ORAL | Status: DC | PRN
Start: 2016-07-24 — End: 2016-07-29

## 2016-07-24 MED ORDER — BENZTROPINE 1 MG/ML IJ SOLN
1 mg/mL | Freq: Two times a day (BID) | INTRAMUSCULAR | Status: DC | PRN
Start: 2016-07-24 — End: 2016-07-29

## 2016-07-24 MED ORDER — WATER FOR INJECTION, STERILE INJECTION
20. mg/mL (final conc.) | Freq: Two times a day (BID) | INTRAMUSCULAR | Status: DC | PRN
Start: 2016-07-24 — End: 2016-07-29

## 2016-07-24 MED FILL — CEPHALEXIN 250 MG CAP: 250 mg | ORAL | Qty: 2

## 2016-07-24 MED FILL — MUPIROCIN 2 % OINTMENT: 2 % | CUTANEOUS | Qty: 22

## 2016-07-24 MED FILL — METFORMIN 500 MG TAB: 500 mg | ORAL | Qty: 1

## 2016-07-24 MED FILL — LYRICA 50 MG CAPSULE: 50 mg | ORAL | Qty: 2

## 2016-07-24 MED FILL — DIVALPROEX  250 MG 24 HR TAB: 250 mg | ORAL | Qty: 1

## 2016-07-24 MED FILL — DILTIAZEM ER 240 MG 24 HR CAP: 240 mg | ORAL | Qty: 1

## 2016-07-24 MED FILL — CLONIDINE 0.1 MG TAB: 0.1 mg | ORAL | Qty: 1

## 2016-07-24 NOTE — Progress Notes (Signed)
TRANSFER - IN REPORT:    Verbal report received from C Lamb RN on Clarence PilgrimJoshua Rodriguez  being received from  [place] for routine progression of care      Report consisted of patient???s Situation, Background, Assessment and   Recommendations(SBAR).     Information from the following report(s) SBAR was reviewed with the receiving nurse.    Opportunity for questions and clarification was provided.      Assessment completed upon patient???s arrival to unit and care assumed.

## 2016-07-24 NOTE — Behavioral Health Treatment Team (Signed)
Patient admitted per TDO to Inpatient General Psychiatry, under the services of Dr.Haine.  Patient currently denies suicidal ideation.  Patient currently denies homicidal ideation.  Patient verbally contracts for safety.  Patient denies psychotic symptoms.  Pt denies ETOH use.  Pt denies drug use.

## 2016-07-24 NOTE — Progress Notes (Addendum)
Skin Assessment performed by: MF, RN and De NurseBakeya, RN    Pt is obese.  He has a large bruise on the left side of his abdomen.  Lower legs are edematous with multiple scabs, and a scab that is open on his left anterior calf.  He has a scab on the back of his head.        Pressure Ulcer Documentation  (COMPLETE ONE LABEL PER PRESSURE ULCER)  For further information, please review corresponding Wound Care flowsheet.      Gerilyn PilgrimJoshua Meidinger has:    No pressure injury noted and pressure ulcer prevention initiated.

## 2016-07-24 NOTE — ED Notes (Signed)
7:16 AM :Pt care assumed from Dr. Smith Robertao, ED provider. Pt complaint(s), current treatment plan, progression and available diagnostic results have been discussed thoroughly.  Rounding occurred:   Intended Disposition: Bed placement in system or state, involuntary TDO  Pending diagnostic reports and/or labs (please list):               Scribe Attestation      Kerri PerchesChristian Dean acting as a scribe for and in the presence of Donzetta SprungAmardeep Gar Glance, MD      July 24, 2016 at 7:16 AM       Provider Attestation:      I personally performed the services described in the documentation, reviewed the documentation, as recorded by the scribe in my presence, and it accurately and completely records my words and actions. July 24, 2016 at 7:16 AM - Donzetta SprungAmardeep Ciel Chervenak, MD

## 2016-07-24 NOTE — ED Notes (Signed)
Pharmacy called for morning medications

## 2016-07-24 NOTE — ED Notes (Signed)
Pt belongings x3 bags and one security envelope turned over to possession to [employer]. [employer] provided with completed EMTALA and patient's chart to transport with pt to [facility] for inpatient psychiatric treatment.

## 2016-07-24 NOTE — Consults (Signed)
Consults by Scarlette Calicoumas,  Carlyn Lemke N, MD at 07/24/16 1739                Author: Scarlette Calicoumas, Lennix Rotundo N, MD  Service: Internal Medicine  Author Type: Physician       Filed: 07/24/16 1751  Date of Service: 07/24/16 1739  Status: Signed          Editor: Scarlette Calicoumas, Amaurie Schreckengost N, MD (Physician)                                                             Consult   PCP: None    Historic Medical Providers   Current Providers as of 07/24/2016   PCP: None   Referring Provider: not found, starting on Wed Jul 24, 2016 12:00 AM   Admitting Provider: Lanagan RochesterWalid Fawaz, MD,  (Active)   Attending Provider: Cheryll CockayneMaria C Haine, MD, starting on Wed Jul 24, 2016  3:09 PM (Active)   Consultants this Hospitalization:    IP CONSULT TO INTERNAL MEDICINE   In Hospital Procedure:    * No surgery found *              Assessment:/plan;         Active Problems:     Bipolar 1 disorder (HCC) (07/24/2016)         ??  DM 2 on oral med    ??  HTN, on poly pharmcy= see MAR    ??  H/o SVT on Cardizem= continue    ??  mobid obesity,    ??  Skin excoriations.  = Bactroban    ??  H/o OSA, not on CPAP. X near one year           Further Plans:        See above      And    ??  Labs in am protocol and    ??  SSi for am would be adequate unless blood sugar (POC) run high then we can adjust up the POC test to tid or ac and hs.    ??  Pt cleared for psychiatric treatment                    Subjective:        Clarence Rodriguez is a 41 y.o. Caucasian male who is being seen for psychiatric medical clearance with underlying medical stable issues noted in the PMH and above . Pt reports on acute medical issue other than skin lesion c/c locally infected stasis dermatitis  .  Pt denies cp sob n/v/d/f/chills, changes in bowel and bladder habits.  .  Pt notes being on HCTZ for fluid but else wise his meds are [facility] with some minor adjustments to his preferred dosing as per orders as in use of metformin in am vs usual  pm dosing, beign on HCTZ and taking the extended release from of Depakote at 750 mg bid.        Pt reports smoking , no alcohol.                  Past Medical History:        Diagnosis  Date         ?  Aggressive outburst       ?  Diabetes (HCC)       ?  Hypertension       ?  Mood disorder (HCC)       ?  Sleep disorder           ?  Suicidal thoughts           History reviewed. No pertinent surgical history.   History reviewed. No pertinent family history.      Social History       Substance Use Topics         ?  Smoking status:  Current Every Day Smoker     ?  Smokeless tobacco:  Never Used         ?  Alcohol use  No            Current Facility-Administered Medications          Medication  Dose  Route  Frequency           ?  ziprasidone (GEODON) 20 mg in sterile water (preservative free) 1 mL injection   20 mg  IntraMUSCular  BID PRN     ?  OLANZapine (ZyPREXA) tablet 5 mg   5 mg  Oral  Q6H PRN     ?  benztropine (COGENTIN) tablet 2 mg   2 mg  Oral  BID PRN     ?  benztropine (COGENTIN) injection 2 mg   2 mg  IntraMUSCular  BID PRN     ?  LORazepam (ATIVAN) injection 2 mg   2 mg  IntraMUSCular  Q4H PRN     ?  LORazepam (ATIVAN) tablet 1 mg   1 mg  Oral  Q4H PRN     ?  zolpidem (AMBIEN) tablet 10 mg   10 mg  Oral  QHS PRN     ?  acetaminophen (TYLENOL) tablet 650 mg   650 mg  Oral  Q4H PRN     ?  ibuprofen (MOTRIN) tablet 400 mg   400 mg  Oral  Q8H PRN     ?  magnesium hydroxide (MILK OF MAGNESIA) 400 mg/5 mL oral suspension 30 mL   30 mL  Oral  DAILY PRN     ?  nicotine (NICODERM CQ) 21 mg/24 hr patch 1 Patch   1 Patch  TransDERmal  DAILY PRN     ?  cloNIDine HCl (CATAPRES) tablet 0.1 mg   0.1 mg  Oral  BID     ?  Marland KitchenPHARMACY TO SUBSTITUTE PER PROTOCOL       Per Protocol     ?  divalproex ER (DEPAKOTE ER) 24 hour tablet 750 mg   750 mg  Oral  BID     ?  [START ON 07/25/2016] lisinopril (PRINIVIL, ZESTRIL) tablet 40 mg   40 mg  Oral  DAILY     ?  [START ON 07/25/2016] montelukast (SINGULAIR) tablet 10 mg   10 mg  Oral  DAILY     ?  pregabalin (LYRICA) capsule 100 mg   100 mg  Oral  BID     ?  [START ON 07/25/2016]  metFORMIN ER (GLUCOPHAGE XR) tablet 500 mg   500 mg  Oral  ACB     ?  insulin lispro (HUMALOG) injection     SubCUTAneous  ACB&D     ?  glucose chewable tablet 16 g   4 Tab  Oral  PRN     ?  dextrose (D50W) injection syrg 12.5-25  g   12.5-25 g  IntraVENous  PRN     ?  glucagon (GLUCAGEN) injection 1 mg   1 mg  IntraMUSCular  PRN           ?  [START ON 07/25/2016] hydroCHLOROthiazide (HYDRODIURIL) tablet 25 mg   25 mg  Oral  DAILY         No Known Allergies       Review of Systems:   A comprehensive review of systems was negative.         Objective:        Intake and Output:                Physical Exam:      Visit Vitals         ?  BP  (!) 146/91 (BP Patient Position: At rest)     ?  Pulse  82     ?  Temp  98 ??F (36.7 ??C)     ?  Resp  16     ?  Ht  5\' 11"  (1.803 m)     ?  Wt  (!) 179.6 kg (396 lb)     ?  SpO2  98%         ?  BMI  55.23 kg/m2         General appearance: no acute distress ,  Alert ;  conversant ;  Eyes: anicteric sclerae, moist conjunctivae; no lid-lag; PERRLA;  HENT: Atraumatic; oropharynx clear with moist mucous membranes and no mucosal ulcerations;  normal hard and soft palate;  Neck: Trachea midline; FROM, supple, no thyromegaly or lymphadenopathy;  Lungs: CTA, with normal respiratory effort and no intercostal retractions  CV: RRR, no MRGs ;  Abdomen: Soft, non-tender; no masses  or HSM;  Extremities: No  peripheral edema or extremity lymphadenopathy;  Skin: Normal temperature ; turgor is nl   ; the texture  nl ; has  bil lower leg stasis dermatitis and  Rash with excoriations/ulcers, ulcers    Psych: Appropriate affect,  alert  and oriented to person, place and time;          Data Review:    No results found for this or any previous visit (from the past 24 hour(s)).                  Signed By:  Scarlette Calico, MD        July 24, 2016

## 2016-07-24 NOTE — Progress Notes (Addendum)
Hospitalist answering service notified of need to notify MD of consult.  Magda Paganiniudrey states that Dr. Christie BeckersMathur is on call.  Waiting on return call from MD.    1704  Second call placed to Hospitalist answering service, requesting return call from MD, to notify of consult.  Malachi BondsGloria states that Dr. Christie BeckersMathur is still on call.    [id number]  Dr. Christie BeckersMathur returned call and was notified of consult.

## 2016-07-24 NOTE — Consults (Signed)
Consult  PCP: None   Historic Medical Providers  Current Providers as of 07/24/2016  PCP: None  Referring Provider: not found, starting on Wed Jul 24, 2016 12:00 AM  Admitting Provider: [place]id Fawaz, MD,  (Active)  Attending Provider: Cheryll CockayneMaria C Haine, MD, starting on Wed Jul 24, 2016  3:09 PM (Active)  Consultants this Hospitalization:   IP CONSULT TO INTERNAL MEDICINE  In Hospital Procedure:   * No surgery found *        Assessment:/plan;      Active Problems:    Bipolar 1 disorder (HCC) (07/24/2016)      ?? DM 2 on oral med   ?? HTN, on poly pharmcy= see MAR   ?? H/o SVT on Cardizem= continue   ?? mobid obesity,   ?? Skin excoriations.  = Bactroban   ?? H/o OSA, not on CPAP. X near one year      Further Plans:     See above    And   ?? Labs in am protocol and   ?? SSi for am would be adequate unless blood sugar (POC) run high then we can adjust up the POC test to tid or ac and hs.   ?? Pt cleared for psychiatric treatment             Subjective:     Clarence PilgrimJoshua Rodriguez is a 41 y.o. Caucasian male who is being seen for psychiatric medical clearance with underlying medical stable issues noted in the PMH and above . Pt reports on acute medical issue other than skin lesion c/c locally infected stasis dermatitis .  Pt denies cp sob n/v/d/f/chills, changes in bowel and bladder habits.  .  Pt notes being on HCTZ for fluid but else wise [facility] with some minor adjustments to his preferred dosing as per orders as in use of metformin in am vs usual pm dosing, beign on HCTZ and taking the extended release from of Depakote at 750 mg bid.     Pt reports smoking , no alcohol.           Past Medical History:   Diagnosis Date   ??? Aggressive outburst    ??? Diabetes (HCC)    ??? Hypertension    ??? Mood disorder (HCC)    ??? Sleep disorder    ??? Suicidal thoughts       History reviewed. No pertinent surgical history.  History reviewed. No pertinent family history.   Social History    Substance Use Topics   ??? Smoking status: Current Every Day Smoker   ??? Smokeless tobacco: Never Used   ??? Alcohol use No       Current Facility-Administered Medications   Medication Dose Route Frequency   ??? ziprasidone (GEODON) 20 mg in sterile water (preservative free) 1 mL injection  20 mg IntraMUSCular BID PRN   ??? OLANZapine (ZyPREXA) tablet 5 mg  5 mg Oral Q6H PRN   ??? benztropine (COGENTIN) tablet 2 mg  2 mg Oral BID PRN   ??? benztropine (COGENTIN) injection 2 mg  2 mg IntraMUSCular BID PRN   ??? LORazepam (ATIVAN) injection 2 mg  2 mg IntraMUSCular Q4H PRN   ??? LORazepam (ATIVAN) tablet 1 mg  1 mg Oral Q4H PRN   ??? zolpidem (AMBIEN) tablet 10 mg  10 mg Oral QHS PRN   ??? acetaminophen (TYLENOL) tablet 650 mg  650 mg Oral Q4H PRN   ??? ibuprofen (MOTRIN) tablet 400 mg  400 mg Oral Q8H PRN   ??? magnesium hydroxide (MILK OF MAGNESIA) 400 mg/5 mL oral suspension 30 mL  30 mL Oral DAILY PRN   ??? nicotine (NICODERM CQ) 21 mg/24 hr patch 1 Patch  1 Patch TransDERmal DAILY PRN   ??? cloNIDine HCl (CATAPRES) tablet 0.1 mg  0.1 mg Oral BID   ??? .PHARMACY TO SUBSTITUTE PER PROTOCOL    Per Protocol   ??? divalproex ER (DEPAKOTE ER) 24 hour tablet 750 mg  750 mg Oral BID   ??? [START ON 07/25/2016] lisinopril (PRINIVIL, ZESTRIL) tablet 40 mg  40 mg Oral DAILY   ??? [START ON 07/25/2016] montelukast (SINGULAIR) tablet 10 mg  10 mg Oral DAILY   ??? pregabalin (LYRICA) capsule 100 mg  100 mg Oral BID   ??? [START ON 07/25/2016] metFORMIN ER (GLUCOPHAGE XR) tablet 500 mg  500 mg Oral ACB   ??? insulin lispro (HUMALOG) injection   SubCUTAneous ACB&D   ??? glucose chewable tablet 16 g  4 Tab Oral PRN   ??? dextrose (D50W) injection syrg 12.5-25 g  12.5-25 g IntraVENous PRN   ??? glucagon (GLUCAGEN) injection 1 mg  1 mg IntraMUSCular PRN   ??? [START ON 07/25/2016] hydroCHLOROthiazide (HYDRODIURIL) tablet 25 mg  25 mg Oral DAILY      No Known Allergies     Review of Systems:  A comprehensive review of systems was negative.     Objective:     Intake and Output:             Physical Exam:   Visit Vitals   ??? BP (!) 146/91 (BP Patient Position: At rest)   ??? Pulse 82   ??? Temp 98 ??F (36.7 ??C)   ??? Resp 16   ??? Ht 5\' 11"  (1.803 m)   ??? Wt (!) 179.6 kg (396 lb)   ??? SpO2 98%   ??? BMI 55.23 kg/m2      General appearance: no acute distress ,  Alert ;  conversant ;  Eyes: anicteric sclerae, moist conjunctivae; no lid-lag; PERRLA;  HENT: Atraumatic; oropharynx clear with moist mucous membranes and no mucosal ulcerations; normal hard and soft palate;  Neck: Trachea midline; FROM, supple, no thyromegaly or lymphadenopathy;  Lungs: CTA, with normal respiratory effort and no intercostal retractions  CV: RRR, no MRGs ;  Abdomen: Soft, non-tender; no masses or HSM;  Extremities: No  peripheral edema or extremity lymphadenopathy;  Skin: Normal temperature ; turgor is nl   ; the texture  nl ; has  bil lower leg stasis dermatitis and  Rash with excoriations/ulcers, ulcers    Psych: Appropriate affect, alert  and oriented to person, place and time;       Data Review:   No results found for this or any previous visit (from the past 24 hour(s)).          Signed By: Scarlette Calico, MD     July 24, 2016

## 2016-07-24 NOTE — Progress Notes (Addendum)
[facility] [place] [facility] and [facility] by Dr Bee [place]Walid Fawaz. Nursing to call report to [phone]. Called [facility] and spoke with Casimiro NeedleMichael and made him aware since pt still on a TDO. ED MD, charge nurse and pt made aware.

## 2016-07-25 LAB — GLUCOSE, FASTING: Glucose: 97 MG/DL (ref 65–100)

## 2016-07-25 LAB — GLUCOSE, POC
Glucose (POC): 92 mg/dL (ref 65–100)
Glucose (POC): 99 mg/dL (ref 65–100)
Glucose (POC): 93 mg/dL (ref 65–100)

## 2016-07-25 LAB — METABOLIC PANEL, COMPREHENSIVE
A-G Ratio: 0.7 — ABNORMAL LOW (ref 1.1–2.2)
ALT (SGPT): 37 U/L (ref 12–78)
AST (SGOT): 24 U/L (ref 15–37)
Albumin: 3.3 g/dL — ABNORMAL LOW (ref 3.5–5.0)
Alk. phosphatase: 95 U/L (ref 45–117)
Anion gap: 6 mmol/L (ref 5–15)
BUN/Creatinine ratio: 11 — ABNORMAL LOW (ref 12–20)
BUN: 10 MG/DL (ref 6–20)
Bilirubin, total: 0.5 MG/DL (ref 0.2–1.0)
CO2: 31 mmol/L (ref 21–32)
Calcium: 8.7 MG/DL (ref 8.5–10.1)
Chloride: 100 mmol/L (ref 97–108)
Creatinine: 0.91 MG/DL (ref 0.70–1.30)
GFR est AA: >60 mL/min/{1.73_m2} (ref >60)
GFR est non-AA: >60 mL/min/{1.73_m2} (ref >60)
Globulin: 4.5 g/dL — ABNORMAL HIGH (ref 2.0–4.0)
Glucose: 94 mg/dL (ref 65–100)
Potassium: 3.9 mmol/L (ref 3.5–5.1)
Protein, total: 7.8 g/dL (ref 6.4–8.2)
Sodium: 137 mmol/L (ref 136–145)

## 2016-07-25 LAB — LIPID PANEL
CHOL/HDL Ratio: 3 (ref 0–5.0)
Cholesterol, total: 107 MG/DL (ref <200)
HDL Cholesterol: 36 MG/DL
LDL, calculated: 37 MG/DL (ref 0–100)
Triglyceride: 170 MG/DL — ABNORMAL HIGH (ref <150)
VLDL, calculated: 34 MG/DL

## 2016-07-25 LAB — TSH 3RD GENERATION: TSH: 2.38 u[IU]/mL (ref 0.36–3.74)

## 2016-07-25 LAB — VALPROIC ACID: Valproic acid: 37 ug/ml — ABNORMAL LOW (ref 50–100)

## 2016-07-25 MED ORDER — DULOXETINE 30 MG CAP, DELAYED RELEASE
30 mg | Freq: Every day | ORAL | Status: DC
Start: 2016-07-25 — End: 2016-07-26
  Administered 2016-07-25 – 2016-07-26 (×2): via ORAL

## 2016-07-25 MED ORDER — ARIPIPRAZOLE 10 MG TAB
10 mg | Freq: Every day | ORAL | Status: DC
Start: 2016-07-25 — End: 2016-07-26
  Administered 2016-07-25 – 2016-07-26 (×2): via ORAL

## 2016-07-25 MED ORDER — LOPERAMIDE 2 MG CAP
2 mg | ORAL | Status: DC | PRN
Start: 2016-07-25 — End: 2016-07-29

## 2016-07-25 MED ORDER — FLU VACCINE QV 2017-18 (36 MOS+)(PF) 60 MCG (15 MCG X 4)/0.5 ML IM SYRINGE
60 mcg (15 mcg x 4)/0.5 mL | INTRAMUSCULAR | Status: AC
Start: 2016-07-25 — End: 2016-07-25
  Administered 2016-07-26: 03:00:00 via INTRAMUSCULAR

## 2016-07-25 MED FILL — MONTELUKAST 10 MG TAB: 10 mg | ORAL | Qty: 1

## 2016-07-25 MED FILL — DILTIAZEM ER 240 MG 24 HR CAP: 240 mg | ORAL | Qty: 1

## 2016-07-25 MED FILL — ARIPIPRAZOLE 10 MG TAB: 10 mg | ORAL | Qty: 1

## 2016-07-25 MED FILL — DULOXETINE 30 MG CAP, DELAYED RELEASE: 30 mg | ORAL | Qty: 1

## 2016-07-25 MED FILL — METFORMIN SR 500 MG 24 HR TABLET: 500 mg | ORAL | Qty: 1

## 2016-07-25 MED FILL — LYRICA 100 MG CAPSULE: 100 mg | ORAL | Qty: 1

## 2016-07-25 MED FILL — CLONIDINE 0.1 MG TAB: 0.1 mg | ORAL | Qty: 1

## 2016-07-25 MED FILL — LORAZEPAM 1 MG TAB: 1 mg | ORAL | Qty: 1

## 2016-07-25 MED FILL — HYDROCHLOROTHIAZIDE 25 MG TAB: 25 mg | ORAL | Qty: 1

## 2016-07-25 MED FILL — DIVALPROEX  250 MG 24 HR TAB: 250 mg | ORAL | Qty: 1

## 2016-07-25 MED FILL — LISINOPRIL 20 MG TAB: 20 mg | ORAL | Qty: 2

## 2016-07-25 NOTE — Behavioral Health Treatment Team (Cosign Needed)
Pt attended part of reflections group.  Discussed some unit procedures, coping skills, and NAMI.  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  Pt stated that he felt better after getting some things off his chest in another group today.  Due to expected mania, pt talked longer than most other pts could tolerate.

## 2016-07-25 NOTE — Progress Notes (Signed)
Problem: Depressed Mood (Adult/Pediatric)  Goal: *STG: Remains safe in hospital  Outcome: Progressing Towards Goal  Pt continues on q15 min checks and High Risk Falls Precautions for safety.  Bed in low position, wheels locked, wheelchair at bedside within reach.

## 2016-07-25 NOTE — Behavioral Health Treatment Team (Signed)
PSYCHOSOCIAL ASSESSMENT  :Patient identifying info:  Clarence PilgrimJoshua Rodriguez is a 41 y.o., male admitted 07/24/2016  3:09 PM     Presenting problem and precipitating factors: [facility] under a TDO for SI with a plan to overdose on medications.  Pt is mourning the loss of his relationship with his ex-partner, the loss of their home, and is now homeless.      Mental status assessment: Depressed    Current psychiatric providers and contact info: [facility]    Previous psychiatric services/providers and response to treatment: Previous inpatient psychiatric hospitalizations    Family history of mental illness: Depression    Substance abuse history:  None  Social History   Substance Use Topics   ??? Smoking status: Current Every Day Smoker   ??? Smokeless tobacco: Never Used   ??? Alcohol use No       Family constellation: None    Is significant other involved? No      Describe support system: None    Describe living arrangements and home environment: Patient is currently homeless  Health issues:   Hospital Problems  Never Reviewed          Codes Class Noted POA    * (Principal)Bipolar 1 disorder (HCC) ICD-10-CM: F31.9  ICD-9-CM: 296.7  07/24/2016 Unknown              Trauma history: Recently broke up with partner, lost home and became homeless    Legal issues: None indicated    History of military service: No    Financial status: SSDI    Religious/cultural factors: None indicated    Education/work history: Unemployed    Have you been licensed as a Designer, jewelleryheath care professional (current or expired): No  Leisure and recreation preferences: None  Describe coping skills: Limited and ineffectual    Clarence Rodriguez  07/25/2016

## 2016-07-25 NOTE — H&P (Signed)
INITIAL PSYCHIATRIC EVALUATION            IDENTIFICATION:    Patient Name  Clarence Rodriguez   Date of Birth [birth year]   CSN [id number]   Medical Record Number  [id number]      Age  41 y.o.   PCP None   Admit date:  07/24/2016    Room Number  733/01  @ [facility]   Date of Service  07/25/2016            HISTORY         REASON FOR HOSPITALIZATION:  CC: "SI depression ". Pt admitted under a temporary detention order (TDO)  for severe depression with suicidal ideations  proving to be an imminent danger to self and others and an inability to care for self.    HISTORY OF PRESENT ILLNESS:    The patient, Clarence Rodriguez, is a 41 y.o.  WHITE OR CAUCASIAN male with a past psychiatric history significant for depression , who presents at this time with complaints of (and/or evidence of) the following emotional symptoms: suicidal thoughts/threats.  Additional symptomatology include anxiety.  The above symptoms have been present for 3 days . These symptoms are of severe severity. These symptoms are constant in nature.  The patient's condition has been precipitated by break upand psychosocial stressors (homeless ).  Patient's condition made worse by continued  bytreatment noncompliance. UDS: +negative; BAL=0.      ALLERGIES: No Known Allergies   MEDICATIONS PRIOR TO ADMISSION:   Prescriptions Prior to Admission   Medication Sig   ??? ARIPiprazole (ABILIFY) 15 mg tablet Take 15 mg by mouth daily.   ??? divalproex DR (DEPAKOTE) 250 mg tablet Take 750 mg by mouth two (2) times a day.   ??? dilTIAZem ER (CARDIZEM LA) 240 mg Tb24 tablet Take 240 mg by mouth daily.   ??? ibuprofen 200 mg cap Take  by mouth.   ??? pregabalin (LYRICA) 100 mg capsule Take 100 mg by mouth three (3) times daily.   ??? metFORMIN (GLUCOPHAGE) 500 mg tablet Take 500 mg by mouth two (2) times daily (with meals).   ??? montelukast (SINGULAIR) 10 mg tablet Take 10 mg by mouth nightly.   ??? lisinopril (PRINIVIL, ZESTRIL) 40 mg tablet Take 40 mg by mouth daily.    ??? cloNIDine HCl (CATAPRES) 0.1 mg tablet Take  by mouth two (2) times a day.      PAST MEDICAL HISTORY:   Past Medical History:   Diagnosis Date   ??? Aggressive outburst    ??? Diabetes ([place])    ??? Hypertension    ??? Mood disorder ([place])    ??? Sleep disorder    ??? Suicidal thoughts    History reviewed. No pertinent surgical history.   SOCIAL HISTORY:    Social History     Social History   ??? Marital status: SINGLE     Spouse name: N/A   ??? Number of children: N/A   ??? Years of education: N/A     Occupational History   ??? Not on file.     Social History Main Topics   ??? Smoking status: Current Every Day Smoker   ??? Smokeless tobacco: Never Used   ??? Alcohol use No   ??? Drug use: No   ??? Sexual activity: Not on file     Other Topics Concern   ??? Not on file     Social History Narrative    41 year old caucasian morbidly  obese amle admitted on TDO for SI depression, in the context of being homeless after breaking up with hhis partner.Pt is diabetic and is non compliant with treatment,      FAMILY HISTORY:    History reviewed. No pertinent family history.    REVIEW OF SYSTEMS:   Psychological ROS: positive for - behavioral disorder  Respiratory ROS: no cough, shortness of breath, or wheezing  Cardiovascular ROS: no chest pain or dyspnea on exertion  Pertinent items are noted in the History of Present Illness.  All other Systems reviewed and are considered negative.           MENTAL STATUS EXAM & VITALS     MENTAL STATUS EXAM (MSE):    MSE FINDINGS ARE WITHIN NORMAL LIMITS (WNL) UNLESS OTHERWISE STATED BELOW. ( ALL OF THE BELOW CATEGORIES OF THE MSE HAVE BEEN REVIEWED (reviewed 07/25/2016) AND UPDATED AS DEEMED APPROPRIATE )  General Presentation age appropriate, cooperative   Orientation oriented to time, place and person   Vital Signs  See below (reviewed 07/25/2016); Vital Signs (BP, Pulse, & Temp) are within normal limits if not listed below.   Gait and Station Stable/steady, no ataxia    Musculoskeletal System No extrapyramidal symptoms (EPS); no abnormal muscular movements or Tardive Dyskinesia (TD); muscle strength and tone are within normal limits   Language No aphasia or dysarthria   Speech:  hyperverbal   Thought Processes logical; normal rate of thoughts; poor abstract reasoning/computation   Thought Associations circumstantial   Thought Content free of delusions   Suicidal Ideations contracts for safety   Homicidal Ideations none   Mood:  anxious    Affect:  mood-congruent   Memory recent  fair   Memory remote:  fair   Concentration/Attention:  distractable   Fund of Knowledge average   Insight:  poor   Reliability poor   Judgment:  poor          VITALS:     Patient Vitals for the past 24 hrs:   Temp Pulse Resp BP SpO2   07/25/16 1145 98 ??F (36.7 ??C) 81 18 130/78 96 %   07/25/16 0720 98 ??F (36.7 ??C) 87 18 145/88 96 %   07/24/16 2026 98.2 ??F (36.8 ??C) 86 16 (!) 167/110 95 %   07/24/16 1549 98 ??F (36.7 ??C) 82 16 (!) 146/91 98 %     Wt Readings from Last 3 Encounters:   07/24/16 (!) 179.6 kg (396 lb)   07/21/16 (!) 179.6 kg (396 lb)     Temp Readings from Last 3 Encounters:   07/25/16 98 ??F (36.7 ??C)   07/24/16 97.7 ??F (36.5 ??C)     BP Readings from Last 3 Encounters:   07/25/16 130/78   07/24/16 (!) 157/92     Pulse Readings from Last 3 Encounters:   07/25/16 81   07/24/16 84            DATA     LABORATORY DATA:  Labs Reviewed   LIPID PANEL - Abnormal; Notable for the following:        Result Value    Triglyceride 170 (*)     All other components within normal limits   METABOLIC PANEL, COMPREHENSIVE - Abnormal; Notable for the following:     BUN/Creatinine ratio 11 (*)     Albumin 3.3 (*)     Globulin 4.5 (*)     A-G Ratio 0.7 (*)     All other components within normal limits  VALPROIC ACID - Abnormal; Notable for the following:     Valproic acid 37 (*)     All other components within normal limits   GLUCOSE, FASTING   TSH 3RD GENERATION   GLUCOSE, POC   GLUCOSE, POC      Admission on 07/24/2016   Component Date Value Ref Range Status   ??? Glucose (POC) 07/24/2016 92  65 - 100 mg/dL Final   ??? Performed by 07/24/2016 RUDD DEBORAH   Final   ??? Glucose 07/25/2016 97  65 - 100 MG/DL Final   ??? TSH 07/25/2016 2.38  0.36 - 3.74 uIU/mL Final   ??? LIPID PROFILE 07/25/2016        Final   ??? Cholesterol, total 07/25/2016 107  <200 MG/DL Final   ??? Triglyceride 07/25/2016 170* <150 MG/DL Final   ??? HDL Cholesterol 07/25/2016 36  MG/DL Final   ??? LDL, calculated 07/25/2016 37  0 - 100 MG/DL Final   ??? VLDL, calculated 07/25/2016 34  MG/DL Final   ??? CHOL/HDL Ratio 07/25/2016 3.0  0 - 5.0   Final   ??? Sodium 07/25/2016 137  136 - 145 mmol/L Final   ??? Potassium 07/25/2016 3.9  3.5 - 5.1 mmol/L Final   ??? Chloride 07/25/2016 100  97 - 108 mmol/L Final   ??? CO2 07/25/2016 31  21 - 32 mmol/L Final   ??? Anion gap 07/25/2016 6  5 - 15 mmol/L Final   ??? Glucose 07/25/2016 94  65 - 100 mg/dL Final   ??? BUN 07/25/2016 10  6 - 20 MG/DL Final   ??? Creatinine 07/25/2016 0.91  0.70 - 1.30 MG/DL Final   ??? BUN/Creatinine ratio 07/25/2016 11* 12 - 20   Final   ??? GFR est AA 07/25/2016 >60  >60 ml/min/1.47m Final   ??? GFR est non-AA 07/25/2016 >60  >60 ml/min/1.764mFinal   ??? Calcium 07/25/2016 8.7  8.5 - 10.1 MG/DL Final   ??? Bilirubin, total 07/25/2016 0.5  0.2 - 1.0 MG/DL Final   ??? ALT (SGPT) 07/25/2016 37  12 - 78 U/L Final   ??? AST (SGOT) 07/25/2016 24  15 - 37 U/L Final   ??? Alk. phosphatase 07/25/2016 95  45 - 117 U/L Final   ??? Protein, total 07/25/2016 7.8  6.4 - 8.2 g/dL Final   ??? Albumin 07/25/2016 3.3* 3.5 - 5.0 g/dL Final   ??? Globulin 07/25/2016 4.5* 2.0 - 4.0 g/dL Final   ??? A-G Ratio 07/25/2016 0.7* 1.1 - 2.2   Final   ??? Valproic acid 07/25/2016 37* 50 - 100 ug/ml Final   ??? Glucose (POC) 07/25/2016 99  65 - 100 mg/dL Final   ??? Performed by 07/25/2016 AnBelinda Block Final   Admission on 07/21/2016, Discharged on 07/24/2016   Component Date Value Ref Range Status    ??? ALCOHOL(ETHYL),SERUM 07/21/2016 <3  0 - 3 MG/DL Final   ??? BENZODIAZEPINES 07/21/2016 NEGATIVE   NEG   Final   ??? BARBITURATES 07/21/2016 NEGATIVE   NEG   Final   ??? THC (TH-CANNABINOL) 07/21/2016 NEGATIVE   NEG   Final   ??? OPIATES 07/21/2016 NEGATIVE   NEG   Final   ??? PCP(PHENCYCLIDINE) 07/21/2016 NEGATIVE   NEG   Final   ??? COCAINE 07/21/2016 NEGATIVE   NEG   Final   ??? AMPHETAMINES 07/21/2016 NEGATIVE   NEG   Final   ??? METHADONE 07/21/2016 NEGATIVE   NEG   Final   ??? HDSCOM  07/21/2016 (NOTE)   Final   ??? WBC 07/21/2016 7.6  4.6 - 13.2 K/uL Final   ??? RBC 07/21/2016 4.41* 4.70 - 5.50 M/uL Final   ??? HGB 07/21/2016 12.9* 13.0 - 16.0 g/dL Final   ??? HCT 07/21/2016 40.8  36.0 - 48.0 % Final   ??? MCV 07/21/2016 92.5  74.0 - 97.0 FL Final   ??? MCH 07/21/2016 29.3  24.0 - 34.0 PG Final   ??? MCHC 07/21/2016 31.6  31.0 - 37.0 g/dL Final   ??? RDW 07/21/2016 14.7* 11.6 - 14.5 % Final   ??? PLATELET 07/21/2016 218  135 - 420 K/uL Final   ??? MPV 07/21/2016 9.8  9.2 - 11.8 FL Final   ??? NEUTROPHILS 07/21/2016 55  40 - 73 % Final   ??? LYMPHOCYTES 07/21/2016 31  21 - 52 % Final   ??? MONOCYTES 07/21/2016 12* 3 - 10 % Final   ??? EOSINOPHILS 07/21/2016 2  0 - 5 % Final   ??? BASOPHILS 07/21/2016 0  0 - 2 % Final   ??? ABS. NEUTROPHILS 07/21/2016 4.2  1.8 - 8.0 K/UL Final   ??? ABS. LYMPHOCYTES 07/21/2016 2.4  0.9 - 3.6 K/UL Final   ??? ABS. MONOCYTES 07/21/2016 0.9  0.05 - 1.2 K/UL Final   ??? ABS. EOSINOPHILS 07/21/2016 0.1  0.0 - 0.4 K/UL Final   ??? ABS. BASOPHILS 07/21/2016 0.0  0.0 - 0.06 K/UL Final   ??? DF 07/21/2016 AUTOMATED    Final   ??? Sodium 07/21/2016 144  136 - 145 mmol/L Final   ??? Potassium 07/21/2016 3.6  3.5 - 5.5 mmol/L Final   ??? Chloride 07/21/2016 105  100 - 108 mmol/L Final   ??? CO2 07/21/2016 29  21 - 32 mmol/L Final   ??? Anion gap 07/21/2016 10  3.0 - 18 mmol/L Final   ??? Glucose 07/21/2016 83  74 - 99 mg/dL Final   ??? BUN 07/21/2016 10  7.0 - 18 MG/DL Final   ??? Creatinine 07/21/2016 0.77  0.6 - 1.3 MG/DL Final    ??? BUN/Creatinine ratio 07/21/2016 13  12 - 20   Final   ??? GFR est AA 07/21/2016 >60  >60 ml/min/1.3m Final   ??? GFR est non-AA 07/21/2016 >60  >60 ml/min/1.786mFinal   ??? Calcium 07/21/2016 8.8  8.5 - 10.1 MG/DL Final   ??? Glucose (POC) 07/21/2016 92  70 - 110 mg/dL Final   ??? Glucose (POC) 07/22/2016 95  70 - 110 mg/dL Final   ??? Ventricular Rate 07/22/2016 89  BPM Final   ??? Atrial Rate 07/22/2016 89  BPM Final   ??? P-R Interval 07/22/2016 144  ms Final   ??? QRS Duration 07/22/2016 90  ms Final   ??? Q-T Interval 07/22/2016 380  ms Final   ??? QTC Calculation (Bezet) 07/22/2016 462  ms Final   ??? Calculated P Axis 07/22/2016 31  degrees Final   ??? Calculated R Axis 07/22/2016 26  degrees Final   ??? Calculated T Axis 07/22/2016 33  degrees Final   ??? Diagnosis 07/22/2016    Final                    Value:Normal sinus rhythm  Normal ECG  No previous ECGs available  Confirmed by PaAileen [email or web address] 07/24/2016 9:05:34 AM          RADIOLOGY REPORTS:  No results found for this or any previous visit.No results found.  MEDICATIONS       ALL MEDICATIONS  Current Facility-Administered Medications   Medication Dose Route Frequency   ??? ARIPiprazole (ABILIFY) tablet 10 mg  10 mg Oral DAILY   ??? DULoxetine (CYMBALTA) capsule 30 mg  30 mg Oral DAILY   ??? loperamide (IMODIUM) capsule 2 mg  2 mg Oral Q4H PRN   ??? influenza vaccine 2017-18 (3 yrs+)(PF) (FLUZONE QUAD/FLUARIX QUAD) injection 0.5 mL  0.5 mL IntraMUSCular PRIOR TO DISCHARGE   ??? ziprasidone (GEODON) 20 mg in sterile water (preservative free) 1 mL injection  20 mg IntraMUSCular BID PRN   ??? OLANZapine (ZyPREXA) tablet 5 mg  5 mg Oral Q6H PRN   ??? benztropine (COGENTIN) tablet 2 mg  2 mg Oral BID PRN   ??? benztropine (COGENTIN) injection 2 mg  2 mg IntraMUSCular BID PRN   ??? LORazepam (ATIVAN) injection 2 mg  2 mg IntraMUSCular Q4H PRN   ??? LORazepam (ATIVAN) tablet 1 mg  1 mg Oral Q4H PRN   ??? zolpidem (AMBIEN) tablet 10 mg  10 mg Oral QHS PRN    ??? acetaminophen (TYLENOL) tablet 650 mg  650 mg Oral Q4H PRN   ??? ibuprofen (MOTRIN) tablet 400 mg  400 mg Oral Q8H PRN   ??? magnesium hydroxide (MILK OF MAGNESIA) 400 mg/5 mL oral suspension 30 mL  30 mL Oral DAILY PRN   ??? nicotine (NICODERM CQ) 21 mg/24 hr patch 1 Patch  1 Patch TransDERmal DAILY PRN   ??? cloNIDine HCl (CATAPRES) tablet 0.1 mg  0.1 mg Oral BID   ??? dilTIAZem CD (CARDIZEM CD) capsule 240 mg  240 mg Oral DAILY   ??? lisinopril (PRINIVIL, ZESTRIL) tablet 40 mg  40 mg Oral DAILY   ??? montelukast (SINGULAIR) tablet 10 mg  10 mg Oral QHS   ??? pregabalin (LYRICA) capsule 100 mg  100 mg Oral BID   ??? metFORMIN ER (GLUCOPHAGE XR) tablet 500 mg  500 mg Oral ACB   ??? insulin lispro (HUMALOG) injection   SubCUTAneous ACB&D   ??? glucose chewable tablet 16 g  4 Tab Oral PRN   ??? dextrose (D50W) injection syrg 12.5-25 g  12.5-25 g IntraVENous PRN   ??? glucagon (GLUCAGEN) injection 1 mg  1 mg IntraMUSCular PRN   ??? hydroCHLOROthiazide (HYDRODIURIL) tablet 25 mg  25 mg Oral DAILY   ??? mupirocin (BACTROBAN) 2 % ointment   Topical BID      SCHEDULED MEDICATIONS  Current Facility-Administered Medications   Medication Dose Route Frequency   ??? ARIPiprazole (ABILIFY) tablet 10 mg  10 mg Oral DAILY   ??? DULoxetine (CYMBALTA) capsule 30 mg  30 mg Oral DAILY   ??? influenza vaccine 2017-18 (3 yrs+)(PF) (FLUZONE QUAD/FLUARIX QUAD) injection 0.5 mL  0.5 mL IntraMUSCular PRIOR TO DISCHARGE   ??? cloNIDine HCl (CATAPRES) tablet 0.1 mg  0.1 mg Oral BID   ??? dilTIAZem CD (CARDIZEM CD) capsule 240 mg  240 mg Oral DAILY   ??? lisinopril (PRINIVIL, ZESTRIL) tablet 40 mg  40 mg Oral DAILY   ??? montelukast (SINGULAIR) tablet 10 mg  10 mg Oral QHS   ??? pregabalin (LYRICA) capsule 100 mg  100 mg Oral BID   ??? metFORMIN ER (GLUCOPHAGE XR) tablet 500 mg  500 mg Oral ACB   ??? insulin lispro (HUMALOG) injection   SubCUTAneous ACB&D   ??? hydroCHLOROthiazide (HYDRODIURIL) tablet 25 mg  25 mg Oral DAILY   ??? mupirocin (BACTROBAN) 2 % ointment   Topical BID  ASSESSMENT & PLAN        The patient, Juventino Pavone, is a 40 y.o.  male who presents at this time for treatment of the following diagnoses:  Patient Active Hospital Problem List:   Bipolar D/O    Assessment: not presenting with mania . Is very depressed    Plan: antidepressant- consider mood stabilizer          I will continue to monitor blood levels (Depakote, Tegretol, lithium, clozapine---a drug with a narrow therapeutic index= NTI) and associated labs for drug therapy implemented that require intense monitoring for toxicity as deemed appropriate based on current medication side effects and pharmacodynamically determined drug 1/2 lives.         A coordinated, multidisplinary treatment team (includes the nurse, unit pharmcist, Catering manager) round was conducted for this initial evaluation with the patient present.     The following regarding medications was addressed during rounds with patient: depakote  the risks and benefits of the proposed medication. The patient was given the opportunity to ask questions. Informed consent given to the use of the above medications.     I will continue to adjust psychiatric and non-psychiatric medications (see above "medication" section and orders section for details) as deemed appropriate & based upon diagnoses and response to treatment.     I have reviewed admission (and previous/old) labs and medical tests in the EHR and or transferring hospital documents. I will continue to order blood tests/labs and diagnostic tests as deemed appropriate and review results as they become available (see orders for details).    I have reviewed old psychiatric and medical records available in the EHR. I Will order additional psychiatric records from other institutions to further elucidate the nature of patient's psychopathology and review once available.    I will gather additional collateral information from friends, family and  o/p treatment team to further elucidate the nature of patient's psychopathology and baselline level of psychiatric functioning.      ESTIMATED LENGTH OF STAY:    3-5 days        STRENGTHS:  Exercising self-direction/Resourceful, Knowledge of medications and Setting and pursuing goals                                        SIGNED:    Isac Caddy, MD  07/25/2016

## 2016-07-25 NOTE — Progress Notes (Signed)
Admission Medication Reconciliation:    Information obtained from:  [facility]([place] [phone]- [phone]    Comments/Recommendations: Updated PTA meds/reviewed patient's allergies.    1)  Added:       - aripiprazole 15mg  daily        2)  Removed:       - cephelexin (from 2/28)    3)  Changed:       - divalproex DR 750mg  BID (from 250mg )       - montelukast 10mg  nightly (from daily)       - pregabalin 100mg  TID (from BID)       Significant PMH/Disease States:   Past Medical History:   Diagnosis Date   ??? Aggressive outburst    ??? Diabetes ([facility])    ??? Hypertension    ??? Mood disorder ([facility])    ??? Sleep disorder    ??? Suicidal thoughts      Chief Complaint for this Admission:  No chief complaint on file.    Allergies:  Review of patient's allergies indicates no known allergies.    Prior to Admission Medications:   Prior to Admission Medications   Prescriptions Last Dose Informant Patient Reported? Taking?   ARIPiprazole (ABILIFY) 15 mg tablet   Yes Yes   Sig: Take 15 mg by mouth daily.   cloNIDine HCl (CATAPRES) 0.1 mg tablet   Yes No   Sig: Take  by mouth two (2) times a day.   dilTIAZem ER (CARDIZEM LA) 240 mg Tb24 tablet   Yes No   Sig: Take 240 mg by mouth daily.   divalproex DR (DEPAKOTE) 250 mg tablet   Yes Yes   Sig: Take 750 mg by mouth two (2) times a day.   ibuprofen 200 mg cap   Yes No   Sig: Take  by mouth.   lisinopril (PRINIVIL, ZESTRIL) 40 mg tablet   Yes No   Sig: Take 40 mg by mouth daily.   metFORMIN (GLUCOPHAGE) 500 mg tablet   Yes No   Sig: Take 500 mg by mouth two (2) times daily (with meals).   montelukast (SINGULAIR) 10 mg tablet   Yes No   Sig: Take 10 mg by mouth nightly.   pregabalin (LYRICA) 100 mg capsule   Yes No   Sig: Take 100 mg by mouth three (3) times daily.      Facility-Administered Medications: None     Foye SpurlingAllison Smith, PharmD, BCPP, BCPS  Clinical Pharmacy Specialist, Behavioral Health

## 2016-07-25 NOTE — Progress Notes (Signed)
Problem: Mobility Impaired (Adult and Pediatric)  Goal: *Acute Goals and Plan of Care (Insert Text)  Physical Therapy Goals  Initiated 07/25/2016  1.  Patient will move from supine to sit and sit to supine  in bed with modified independence within 7 day(s).    2.  Patient will transfer from bed to chair and chair to bed with modified independence using the least restrictive device within 7 day(s).  3.  Patient will perform sit to stand with modified independence within 7 day(s).  4.  Patient will ambulate with modified independence for 150 feet with the least restrictive device within 7 day(s).        physical Therapy EVALUATION  Patient: Clarence Rodriguez (41 y.o. male)  Date: 07/25/2016  Primary Diagnosis: bipolar  Bipolar 1 disorder (HCC)        Precautions: Fall       ASSESSMENT :  Based on the objective data described below, the patient presents with a hx of bipolar disorder, mood swings, and aggressive outbursts with referral to therapy d/t c/o knee pain and difficulty walking. He is a limited historian stating that he had a rolling walker given to him from another hospital which he returned but his home info indicates that he owns one. When asked about PLOF he started with being retained by the police and brought to the hospital vs hx from > 1 week ago. Completed a trial with a bariatric rolling walker and gait training x50' with [facility]. Noted a wide base of support and reluctance to stand with right heel down during stance but without overt LOB.   He states that his right knee is painful > left and that he was assessed for DJD which ended in a right knee injection in December. He states that his pain has been much worse since receiving an injection.  Mobility improved with use of RW and asked him to use this for shorter distances at present to lessen risk of fall from fatigue or pain. Discharge recommendations are difficult d/t a poor living situation  (currently homeless). Would recommend possible discharge to SNF vs with follow up services.    Patient will benefit from skilled intervention to address the above impairments.  Patient???s rehabilitation potential is considered to be Good  Factors which may influence rehabilitation potential include:   [x]          None noted  []          Mental ability/status  []          Medical condition  []          Home/family situation and support systems  []          Safety awareness  []          Pain tolerance/management  []          Other:      PLAN :  Recommendations and Planned Interventions:  [x]            Bed Mobility Training             [x]     Neuromuscular Re-Education  [x]            Transfer Training                   []     Orthotic/Prosthetic Training  [x]            Gait Training                         []   Modalities  [x]            Therapeutic Exercises           []     Edema Management/Control  [x]            Therapeutic Activities            []     Patient and Family Training/Education  []            Other (comment):    Frequency/Duration: Patient will be followed by physical therapy  5 times a week to address goals.  Discharge Recommendations: To Be Determined  Further Equipment Recommendations for Discharge: bariatric rolling walker if he truly does not own one     SUBJECTIVE:   Patient stated ???I will do anything you ask. If you want to try the e-stim or something else we can.???    OBJECTIVE DATA SUMMARY:   HISTORY:    Past Medical History:   Diagnosis Date   ??? Aggressive outburst    ??? Diabetes (HCC)    ??? Hypertension    ??? Mood disorder (HCC)    ??? Sleep disorder    ??? Suicidal thoughts    History reviewed. No pertinent surgical history.  Prior Level of Function/Home Situation: ambulating with rolling walker as of December 2017, Unclear on most recent  Personal factors and/or comorbidities impacting plan of care: obesity    Home Situation  Home Environment: Other (comment) (homeless)  # Steps to Enter: 0   One/Two Story Residence: One story  Living Alone: Yes  Support Systems: None  Patient Expects to be Discharged to:: Unknown  Current DME Used/Available at Home: Dan HumphreysWalker, Glucometer    EXAMINATION/PRESENTATION/DECISION MAKING:   Critical Behavior:  Neurologic State: Alert  Orientation Level: Oriented X4        Hearing:  Auditory  Auditory Impairment: Hard of hearing, bilateral, Hard of hearing, left side  Skin:  Bilateral shins covered small open wounds  Edema: BLE  Range Of Motion:  AROM: Generally decreased, functional                       Strength:    Strength: Generally decreased, functional                    Tone & Sensation:   Tone: Normal              Sensation: Intact               Coordination:  Coordination: Generally decreased, functional  Vision:      Functional Mobility:  Bed Mobility:              Transfers:  Sit to Stand: Modified independent  Stand to Sit: Stand-by assistance                       Balance:   Sitting: Intact  Standing: Intact;With support  Ambulation/Gait Training:  Distance (ft): 50 Feet (ft)  Assistive Device: Walker, rolling  Ambulation - Level of Assistance: Contact guard assistance     Gait Description (WDL): Exceptions to WDL  Gait Abnormalities: Decreased step clearance;Trunk sway increased;Antalgic        Base of Support: Widened     Speed/Cadence: Slow                        Stairs:  Therapeutic Exercises:       Functional Measure:  Tinetti test:    Sitting Balance: 1  Arises: 1  Attempts to Rise: 2  Immediate Standing Balance: 1  Standing Balance: 1  Nudged: 2  Eyes Closed: 1  Turn 360 Degrees - Continuous/Discontinuous: 1  Turn 360 Degrees - Steady/Unsteady: 1  Sitting Down: 1  Balance Score: 12  Indication of Gait: 1  R Step Length/Height: 1  L Step Length/Height: 1  R Foot Clearance: 1  L Foot Clearance: 1  Step Symmetry: 0  Step Continuity: 1  Path: 1  Trunk: 0  Walking Time: 0  Gait Score: 7  Total Score: 19       Tinetti Test and G-code impairment scale:   Percentage of Impairment CH    0%   CI    1-19% CJ    20-39% CK    40-59% CL    60-79% CM    80-99% CN     100%   Tinetti  Score 0-28 28 23-27 17-22 12-16 6-11 1-5 0       Tinetti Tool Score Risk of Falls  <19 = High Fall Risk  19-24 = Moderate Fall Risk  25-28 = Low Fall Risk  Tinetti ME. Performance-Oriented Assessment of Mobility Problems in Elderly Patients. JAGS 1986; [id number]. (Scoring Description: PT Bulletin Feb. 10, 1993)    Older adults: Lonn Georgia et al, 2009; n = 1000 [place] elderly evaluated with ABC, POMA, ADL, and IADL)  ?? Mean POMA score for males aged 65-79 years = 26.21(3.40)  ?? Mean POMA score for females age 16-79 years = 25.16(4.30)  ?? Mean POMA score for males over 80 years = 23.29(6.02)  ?? Mean POMA score for females over 80 years = 17.20(8.32)         G codes:  In compliance with CMS???s Claims Based Outcome Reporting, the following G-code set was chosen for this patient based on their primary functional limitation being treated:    The outcome measure chosen to determine the severity of the functional limitation was the Tinetti with a score of 19/28 which was correlated with the impairment scale.    ? Mobility - Walking and Moving Around:    [phone] - CURRENT STATUS: CJ - 20%-39% impaired, limited or restricted   [id number] - GOAL STATUS: CK - 40%-59% impaired, limited or restricted   U0454 - D/C STATUS:  ---------------To be determined---------------      Physical Therapy Evaluation Charge Determination   History Examination Presentation Decision-Making   MEDIUM  Complexity : 1-2 comorbidities / personal factors will impact the outcome/ POC  MEDIUM Complexity : 3 Standardized tests and measures addressing body structure, function, activity limitation and / or participation in recreation  MEDIUM Complexity : Evolving with changing characteristics  LOW Complexity : FOTO score of 75-100      Based on the above components, the patient evaluation is determined to be of the following complexity level: LOW      Pain:  Pain Scale 1: Numeric (0 - 10)  Pain Intensity 1: 0              Activity Tolerance:   DOE but SpO2 and HR stable    After treatment:   [x]          Patient left in no apparent distress sitting up in chair  []          Patient left in no apparent distress in bed  [x]   Call bell left within reach  [x]          Nursing notified  []          Caregiver present  []          Bed alarm activated    COMMUNICATION/EDUCATION:   The patient???s plan of care was discussed with: Registered Nurse.  [x]          Fall prevention education was provided and the patient/caregiver indicated understanding.  [x]          Patient/family have participated as able in goal setting and plan of care.  []          Patient/family agree to work toward stated goals and plan of care.  []          Patient understands intent and goals of therapy, but is neutral about his/her participation.  []          Patient is unable to participate in goal setting and plan of care.    Thank you for this referral.  Cathie Olden, PT, DPT   Time Calculation: 29 mins

## 2016-07-25 NOTE — Behavioral Health Treatment Team (Signed)
Admission reviewed for medical necessity. Will follow with care mngt.

## 2016-07-25 NOTE — Behavioral Health Treatment Team (Signed)
GROUP THERAPY PROGRESS NOTE    Clarence PilgrimJoshua Rodriguez participated in a Process Group with a focus identifying feelings, planning for the rest of the day, and reviewing DBT skills for managing emotional distress.     Group time: 75 minutes.     Personal goal for participation: To increase the capacity to improve one???s mood, structure, and coping capacity.     Goal orientation: The patient will be able to identify their feelings, develop a plan for structuring their day, and begin to appreciate the possibility of successfully managing distress and other forms of intense emotions.      Group therapy participation: With prompting, this patient participated in the group.     Therapeutic interventions reviewed and discussed: The group members were asked to introduce themselves to each other and to see if they could identify an emotion they are having and/or let the group know what they want to focus on for the day as they continue to make discharge plans.   The group members also reviewed five suggested areas of DBT options when experiencing intense emotions: distraction, improve the moment, self-soothe, pros and cons, and radical acceptance. They were asked to take turns reading from the handout and discussing its contents. At the end of group the patients were provided a summary of the topics covered.     Impression of participation: The patient said he came to the hospital because, "I wanted some help.Marland Kitchen.Marland Kitchen.I was picked up by a policeman in [place] while I was sitting in a median strip and thinking about taking a bottle of pills...he [the policeman] said he could see I was in pain.Marland Kitchen.Marland Kitchen.[facility] and they found a bed for me here." The patient then went on to describe a recent breakup with his male partner of five years. He said he had become homeless and that he was tired of living in the shelters available to him in [place], having "to  sleep on the floor." He indicated that his mother was an alcoholic and that she died in [id number]. 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 The patient was tearful and crying when he talked about how empty he felt after his partner left him in December of last year. The patient expressed no current SI/HI and displayed no overt psychotic symptoms. His affect was mostly sad and depressed. His mood matched his affect. This was the patient's first process group with the undersigned.

## 2016-07-25 NOTE — Behavioral Health Treatment Team (Signed)
PSYCHIATRIC PROGRESS NOTE         Patient Name  Clarence Rodriguez   Date of Birth 03-03-1976   CSN [id number]   Medical Record Number  [id number]      Age  41 y.o.   PCP None   Admit date:  07/24/2016    Room Number  733/01  @ [facility]   Date of Service  07/25/2016          PSYCHOTHERAPY SESSION NOTE:  Length of psychotherapy session: 45 minutes    Main condition/diagnosis/issues treated during session today, 07/25/2016 : coping skills, anxiety management    I employed Cognitive Behavioral therapy techniques, Reality-Oriented psychotherapy, as well as supportive psychotherapy in regards to various ongoing psychosocial stressors, including the following: pre-admission and current problems; housing issues; occupational issues; medical issues; and stress of hospitalization. Interpersonal relationship issues and psychodynamic conflicts explored.  Attempts made to alleviate maladaptive patterns. We, also, worked on issues of denial & effects of substance dependency/use     Overall, patient is not progressing    Treatment Plan Update (reviewed an updated 07/25/2016) :  I will modify psychotherapy tx plan by implementing more stress management strategies, building upon cognitive behavioral techniques, increasing coping skills, as well as shoring up psychological defenses).    An extended energy and skill set was needed to engage pt in psychotherapy due to some of the following: resistiveness, complexity, negativity, confrontational nature, hostile behaviors, and/or severe abnormalities in thought processes/psychosis resulting in the loss of expressive/receptive language communication skills.                            E & M PROGRESS NOTE:         HISTORY       CC:  "SI depression "  HISTORY OF PRESENT ILLNESS/INTERVAL HISTORY:  (reviewed/updated 07/25/2016).  per initial evaluation:     Clarence Rodriguez presents/reports/evidences the following emotional  symptoms today, 07/25/2016:depression. The above symptoms have been present for 3 months . These symptoms are of severe severity. The symptoms are intermittent/ fleeting in nature. Additional symptomatology and features include anxiety.       SIDE EFFECTS: (reviewed/updated 07/25/2016)  None reported or admitted to.  No noted toxicity with use of Depakote/Tegretol/lithium/Clozaril/TCAs   ALLERGIES:(reviewed/updated 07/25/2016)  No Known Allergies   MEDICATIONS PRIOR TO ADMISSION:(reviewed/updated 07/25/2016)  Prescriptions Prior to Admission   Medication Sig   ??? ARIPiprazole (ABILIFY) 15 mg tablet Take 15 mg by mouth daily.   ??? divalproex DR (DEPAKOTE) 250 mg tablet Take 750 mg by mouth two (2) times a day.   ??? dilTIAZem ER (CARDIZEM LA) 240 mg Tb24 tablet Take 240 mg by mouth daily.   ??? ibuprofen 200 mg cap Take  by mouth.   ??? pregabalin (LYRICA) 100 mg capsule Take 100 mg by mouth three (3) times daily.   ??? metFORMIN (GLUCOPHAGE) 500 mg tablet Take 500 mg by mouth two (2) times daily (with meals).   ??? montelukast (SINGULAIR) 10 mg tablet Take 10 mg by mouth nightly.   ??? lisinopril (PRINIVIL, ZESTRIL) 40 mg tablet Take 40 mg by mouth daily.   ??? cloNIDine HCl (CATAPRES) 0.1 mg tablet Take  by mouth two (2) times a day.      PAST MEDICAL HISTORY: Past medical history from the initial psychiatric evaluation has been reviewed (reviewed/updated 07/25/2016) with no additional updates (I asked patient and no additional past medical history provided). Past Medical  History:   Diagnosis Date   ??? Aggressive outburst    ??? Diabetes ([place])    ??? Hypertension    ??? Mood disorder ([place])    ??? Sleep disorder    ??? Suicidal thoughts    History reviewed. No pertinent surgical history.   SOCIAL HISTORY: Social history from the initial psychiatric evaluation has been reviewed (reviewed/updated 07/25/2016) with no additional updates (I asked patient and no additional social history provided). Social History     Social History   ??? Marital status: SINGLE      Spouse name: N/A   ??? Number of children: N/A   ??? Years of education: N/A     Occupational History   ??? Not on file.     Social History Main Topics   ??? Smoking status: Current Every Day Smoker   ??? Smokeless tobacco: Never Used   ??? Alcohol use No   ??? Drug use: No   ??? Sexual activity: Not on file     Other Topics Concern   ??? Not on file     Social History Narrative    41 year old caucasian morbidly obese amle admitted on TDO for SI depression, in the context of being homeless after breaking up with hhis partner.Pt is diabetic and is non compliant with treatment,      FAMILY HISTORY: Family history from the initial psychiatric evaluation has been reviewed (reviewed/updated 07/25/2016) with no additional updates (I asked patient and no additional family history provided). History reviewed. No pertinent family history.    REVIEW OF SYSTEMS: (reviewed/updated 07/25/2016)  Appetite:no change from normal   Sleep: does not feel rested   All other Review of Systems: Psychological ROS: positive for - behavioral disorder  Respiratory ROS: no cough, shortness of breath, or wheezing  Cardiovascular ROS: no chest pain or dyspnea on exertion         [place] (MSE):    MSE FINDINGS ARE WITHIN NORMAL LIMITS (WNL) UNLESS OTHERWISE STATED BELOW. ( ALL OF THE BELOW CATEGORIES OF THE MSE HAVE BEEN REVIEWED (reviewed 07/25/2016) AND UPDATED AS DEEMED APPROPRIATE )  General Presentation age appropriate, cooperative   Orientation oriented to time, place and person   Vital Signs  See below (reviewed 07/25/2016); Vital Signs (BP, Pulse, & Temp) are within normal limits if not listed below.   Gait and Station Stable/steady, no ataxia   Musculoskeletal System No extrapyramidal symptoms (EPS); no abnormal muscular movements or Tardive Dyskinesia (TD); muscle strength and tone are within normal limits   Language No aphasia or dysarthria   Speech:  hyperverbal    Thought Processes logical; normal rate of thoughts; poor abstract reasoning/computation   Thought Associations circumstantial   Thought Content free of delusions   Suicidal Ideations contracts for safety   Homicidal Ideations none   Mood:  irritable   Affect:  mood-congruent   Memory recent  fair   Memory remote:  fair   Concentration/Attention:  distractable   Fund of Knowledge average   Insight:  poor   Reliability poor   Judgment:  poor          VITALS:     Patient Vitals for the past 24 hrs:   Temp Pulse Resp BP SpO2   07/25/16 1145 98 ??F (36.7 ??C) 81 18 130/78 96 %   07/25/16 0720 98 ??F (36.7 ??C) 87 18 145/88 96 %   07/24/16 2026 98.2 ??F (36.8 ??  C) 86 16 (!) 167/110 95 %   07/24/16 1549 98 ??F (36.7 ??C) 82 16 (!) 146/91 98 %     Wt Readings from Last 3 Encounters:   07/24/16 (!) 179.6 kg (396 lb)   07/21/16 (!) 179.6 kg (396 lb)     Temp Readings from Last 3 Encounters:   07/25/16 98 ??F (36.7 ??C)   07/24/16 97.7 ??F (36.5 ??C)     BP Readings from Last 3 Encounters:   07/25/16 130/78   07/24/16 (!) 157/92     Pulse Readings from Last 3 Encounters:   07/25/16 81   07/24/16 84            DATA     LABORATORY DATA:(reviewed/updated 07/25/2016)  Recent Results (from the past 24 hour(s))   GLUCOSE, POC    Collection Time: 07/24/16  8:44 PM   Result Value Ref Range    Glucose (POC) 92 65 - 100 mg/dL    Performed by Julianne Rice    GLUCOSE, FASTING    Collection Time: 07/25/16  6:36 AM   Result Value Ref Range    Glucose 97 65 - 100 MG/DL   TSH 3RD GENERATION    Collection Time: 07/25/16  6:36 AM   Result Value Ref Range    TSH 2.38 0.36 - 3.74 uIU/mL   LIPID PANEL    Collection Time: 07/25/16  6:36 AM   Result Value Ref Range    LIPID PROFILE          Cholesterol, total 107 <200 MG/DL    Triglyceride 170 (H) <150 MG/DL    HDL Cholesterol 36 MG/DL    LDL, calculated 37 0 - 100 MG/DL    VLDL, calculated 34 MG/DL    CHOL/HDL Ratio 3.0 0 - 5.0     METABOLIC PANEL, COMPREHENSIVE    Collection Time: 07/25/16  6:36 AM    Result Value Ref Range    Sodium 137 136 - 145 mmol/L    Potassium 3.9 3.5 - 5.1 mmol/L    Chloride 100 97 - 108 mmol/L    CO2 31 21 - 32 mmol/L    Anion gap 6 5 - 15 mmol/L    Glucose 94 65 - 100 mg/dL    BUN 10 6 - 20 MG/DL    Creatinine 0.91 0.70 - 1.30 MG/DL    BUN/Creatinine ratio 11 (L) 12 - 20      GFR est AA >60 >60 ml/min/1.23m    GFR est non-AA >60 >60 ml/min/1.754m   Calcium 8.7 8.5 - 10.1 MG/DL    Bilirubin, total 0.5 0.2 - 1.0 MG/DL    ALT (SGPT) 37 12 - 78 U/L    AST (SGOT) 24 15 - 37 U/L    Alk. phosphatase 95 45 - 117 U/L    Protein, total 7.8 6.4 - 8.2 g/dL    Albumin 3.3 (L) 3.5 - 5.0 g/dL    Globulin 4.5 (H) 2.0 - 4.0 g/dL    A-G Ratio 0.7 (L) 1.1 - 2.2     VALPROIC ACID    Collection Time: 07/25/16  6:36 AM   Result Value Ref Range    Valproic acid 37 (L) 50 - 100 ug/ml   GLUCOSE, POC    Collection Time: 07/25/16  7:43 AM   Result Value Ref Range    Glucose (POC) 99 65 - 100 mg/dL    Performed by AnBelinda Block    Lab Results  Component Value Date/Time    Valproic acid 37 (L) 07/25/2016 06:36 AM     No results found for: LITHM   RADIOLOGY REPORTS:(reviewed/updated 07/25/2016)  No results found.       MEDICATIONS     ALL MEDICATIONS:   Current Facility-Administered Medications   Medication Dose Route Frequency   ??? ARIPiprazole (ABILIFY) tablet 10 mg  10 mg Oral DAILY   ??? DULoxetine (CYMBALTA) capsule 30 mg  30 mg Oral DAILY   ??? loperamide (IMODIUM) capsule 2 mg  2 mg Oral Q4H PRN   ??? influenza vaccine 2017-18 (3 yrs+)(PF) (FLUZONE QUAD/FLUARIX QUAD) injection 0.5 mL  0.5 mL IntraMUSCular PRIOR TO DISCHARGE   ??? ziprasidone (GEODON) 20 mg in sterile water (preservative free) 1 mL injection  20 mg IntraMUSCular BID PRN   ??? OLANZapine (ZyPREXA) tablet 5 mg  5 mg Oral Q6H PRN   ??? benztropine (COGENTIN) tablet 2 mg  2 mg Oral BID PRN   ??? benztropine (COGENTIN) injection 2 mg  2 mg IntraMUSCular BID PRN   ??? LORazepam (ATIVAN) injection 2 mg  2 mg IntraMUSCular Q4H PRN    ??? LORazepam (ATIVAN) tablet 1 mg  1 mg Oral Q4H PRN   ??? zolpidem (AMBIEN) tablet 10 mg  10 mg Oral QHS PRN   ??? acetaminophen (TYLENOL) tablet 650 mg  650 mg Oral Q4H PRN   ??? ibuprofen (MOTRIN) tablet 400 mg  400 mg Oral Q8H PRN   ??? magnesium hydroxide (MILK OF MAGNESIA) 400 mg/5 mL oral suspension 30 mL  30 mL Oral DAILY PRN   ??? nicotine (NICODERM CQ) 21 mg/24 hr patch 1 Patch  1 Patch TransDERmal DAILY PRN   ??? cloNIDine HCl (CATAPRES) tablet 0.1 mg  0.1 mg Oral BID   ??? dilTIAZem CD (CARDIZEM CD) capsule 240 mg  240 mg Oral DAILY   ??? lisinopril (PRINIVIL, ZESTRIL) tablet 40 mg  40 mg Oral DAILY   ??? montelukast (SINGULAIR) tablet 10 mg  10 mg Oral QHS   ??? pregabalin (LYRICA) capsule 100 mg  100 mg Oral BID   ??? metFORMIN ER (GLUCOPHAGE XR) tablet 500 mg  500 mg Oral ACB   ??? insulin lispro (HUMALOG) injection   SubCUTAneous ACB&D   ??? glucose chewable tablet 16 g  4 Tab Oral PRN   ??? dextrose (D50W) injection syrg 12.5-25 g  12.5-25 g IntraVENous PRN   ??? glucagon (GLUCAGEN) injection 1 mg  1 mg IntraMUSCular PRN   ??? hydroCHLOROthiazide (HYDRODIURIL) tablet 25 mg  25 mg Oral DAILY   ??? mupirocin (BACTROBAN) 2 % ointment   Topical BID      SCHEDULED MEDICATIONS:   Current Facility-Administered Medications   Medication Dose Route Frequency   ??? ARIPiprazole (ABILIFY) tablet 10 mg  10 mg Oral DAILY   ??? DULoxetine (CYMBALTA) capsule 30 mg  30 mg Oral DAILY   ??? influenza vaccine 2017-18 (3 yrs+)(PF) (FLUZONE QUAD/FLUARIX QUAD) injection 0.5 mL  0.5 mL IntraMUSCular PRIOR TO DISCHARGE   ??? cloNIDine HCl (CATAPRES) tablet 0.1 mg  0.1 mg Oral BID   ??? dilTIAZem CD (CARDIZEM CD) capsule 240 mg  240 mg Oral DAILY   ??? lisinopril (PRINIVIL, ZESTRIL) tablet 40 mg  40 mg Oral DAILY   ??? montelukast (SINGULAIR) tablet 10 mg  10 mg Oral QHS   ??? pregabalin (LYRICA) capsule 100 mg  100 mg Oral BID   ??? metFORMIN ER (GLUCOPHAGE XR) tablet 500 mg  500 mg Oral ACB   ???  insulin lispro (HUMALOG) injection   SubCUTAneous ACB&D    ??? hydroCHLOROthiazide (HYDRODIURIL) tablet 25 mg  25 mg Oral DAILY   ??? mupirocin (BACTROBAN) 2 % ointment   Topical BID          ASSESSMENT & PLAN     DIAGNOSES REQUIRING ACTIVE TREATMENT AND MONITORING: (reviewed/updated 07/25/2016)  Patient Active Hospital Problem List:   Bipolar 1 disorder ([place]) (07/24/2016)    Assessment: does not present with mania-  Depressed    Plan: antidepressant - consider mood stabilizer            I will continue to monitor blood levels (Depakote, Tegretol, lithium, clozapine---a drug with a narrow therapeutic index= NTI) and associated labs for drug therapy implemented that require intense monitoring for toxicity as deemed appropriate based on current medication side effects and pharmacodynamically determined drug 1/2 lives.    In summary, Jamai Dolce, is a 41 y.o.  male who presents with a severe exacerbation of the principal diagnosis of Bipolar 1 disorder ([place])  Patient's condition is worsening/not improving/not stable improving.  Patient requires continued inpatient hospitalization for further stabilization, safety monitoring and medication management.  I will continue to coordinate the provision of individual, milieu, occupational, group, and substance abuse therapies to address target symptoms/diagnoses as deemed appropriate for the individual patient.  A coordinated, multidisplinary treatment team round was conducted with the patient (this team consists of the nurse, psychiatric unit pharmcist, Catering manager).     Complete current electronic health record for patient has been reviewed today including consultant notes, ancillary staff notes, nurses and psychiatric tech notes.    Suicide risk assessment completed and patient deemed to be of low risk for suicide at this time.     The following regarding medications was addressed during rounds with patient:   the risks and benefits of the proposed medication. The patient was given  the opportunity to ask questions. Informed consent given to the use of the above medications. Will continue to adjust psychiatric and non-psychiatric medications (see above "medication" section and orders section for details) as deemed appropriate & based upon diagnoses and response to treatment.     I will continue to order blood tests/labs and diagnostic tests as deemed appropriate and review results as they become available (see orders for details and above listed lab/test results).    I will order psychiatric records from previous psych hospitals to further elucidate the nature of patient's psychopathology and review once available.    I will gather additional collateral information from friends, family and o/p treatment team to further elucidate the nature of patient's psychopathology and baselline level of psychiatric functioning.         I certify that this patient's inpatient psychiatric hospital services furnished since the previous certification were, and continue to be, required for treatment that could reasonably be expected to improve the patient's condition, or for diagnostic study, and that the patient continues to need, on a daily basis, active treatment furnished directly by or requiring the supervision of inpatient psychiatric facility personnel. In addition, the hospital records show that services furnished were intensive treatment services, admission or related services, or equivalent services.    EXPECTED DISCHARGE DATE/DAY: TBD     DISPOSITION: Home       Signed By:   Isac Caddy, MD  07/25/2016

## 2016-07-25 NOTE — Other (Addendum)
Behavioral Health Interdisciplinary Rounds     Patient Name: Clarence Rodriguez  Age: 41 y.o.  Room/Bed:  733/01  Primary Diagnosis: <principal problem not specified>   Admission Status: TDO     Readmission within 30 days: no  Power of Attorney in place: no  Patient requires a blocked bed: no          Reason for blocked bed: n/a    VTE Prophylaxis: No  Flu vaccine given : no   Mobility needs/Fall risk: yes    Nutritional Plan: no  Consults: H&P completed         Labs/Testing due today?: yes   (TSH, fasting glucose, lipid panel, CMP, Valproic Acid) - Labs drawn and sent    Sleep hours:  5 1/4 hrs      Participation in Care/Groups:  New Admit  Medication Compliant?: Yes  PRNS (last 24 hours): None    Restraints (last 24 hours):  no  Substance Abuse:  no  CIWA (range last 24 hours):  COWS (range last 24 hours):   Alcohol screening (AUDIT) completed -  AUDIT Score: 0  If applicable, date SBIRT discussed in treatment team AND documented: N/A  Tobacco - patient is a smoker: yes   Date tobacco education completed by RN: 07/24/2016  24 hour chart check complete: yes     Patient goal(s) for today: attend all groups  Treatment team focus/goals: psychosocial  Progress note: Patient mourning the loss of his relationship and home    LOS:  1  Expected LOS: TBD    Financial concerns/prescription coverage: [employer]  Date of last family contact: None      Family requesting physician contact today: No  Discharge plan: TBD  Guns in the home: No        Outpatient provider(s): TBD    Participating treatment team members: Clarence Rodriguez, Valaria GoodKatherine Wallace, MSW; Dr. Mabeline CarasHaine, MD; Sheldon SilvanJessica Lurey, RN; Harless LittenAllie Smith, PharmD

## 2016-07-25 NOTE — Progress Notes (Signed)
Participated in General Behavioral Health spirituality group.  Topic for discussion was forming a relationship with God.    Ronald L. Stilwell  [facility] Staff Chaplain (Catholic Patient Care Specialist)  Chaplain Paging Service [phone])

## 2016-07-25 NOTE — Behavioral Health Treatment Team (Signed)
GROUP THERAPY PROGRESS NOTE    Clarence PilgrimJoshua Rodriguez is participating in Discharge Planning Group    Group time: 1.5 hour    Personal goal for participation:  Readiness for discharge    Goal orientation: Developing An Effective Support System    Group therapy participation: active    Therapeutic interventions reviewed and discussed:     Impression of participation: Very talkative, engaging and claims to have a good support system.

## 2016-07-25 NOTE — Behavioral Health Treatment Team (Addendum)
0230  Pt up in room in wheelchair.  States has been to bathroom and has not slept.  Writer discussed with patient that he appeared to be sleeping on rounds and pt stated he could not sleep and wanted to stay up on unit.  Redirected pt back to bed to promote rest/sleep.  Pt irritable, Facilities managerchallenging writer verbally.  Explained again to pt need to stay in bed.  Requested water pitcher filled. Pt back in bed and received positive feedback from writer for his compliance.    [id number]  Water pitcher filled and given to patient.    0300  Pt resting in bed with eyes closed.  Appears to be asleep.    0500  Pt up in wheelchair at threshold to his room.  States "I can't sleep."  When told by Clinical research associatewriter he was observed asleep he states, "Well, my bladder wakes me up to go to the bathroom."  Told he could come out of his room at 0600 and pt stated he wants a "shower."  Told showers are done on day shift.  Said he could take one in his bathroom.  Told patient day shift does showers and he may not be able to use that shower because of the tub.  States he will sit in the wheelchair at his doorway.  Pt states he uses CPAP at home and knows the settings.  Will pass on in report for treatment team.  Pt appears very entitled and becomes frustrated and irritable when he has to comply with unit rules like everyone else.  Will continue to monitor and assess pt.

## 2016-07-25 NOTE — Progress Notes (Signed)
Problem: Depressed Mood (Adult/Pediatric)  Goal: *STG: Participates in treatment plan  Outcome: Progressing Towards Goal  Review meds, out on unit social w peers and staff. Mood and affect sad and angry at times. Daily goal is to talk with tx team about meds and housing.   Goal: *STG: Verbalizes anger, guilt, and other feelings in a constructive manor  Outcome: Progressing Towards Goal  Lonely, sad and rejected.   Goal: *STG: Attends activities and groups  Outcome: Progressing Towards Goal  engaged  Goal: *STG: Demonstrates reduction in symptoms and increase in insight into coping skills/future focused  Outcome: Progressing Towards Goal  Denies SI, no self harming behaviors. Symptoms such as anger decreased, loneliness remains. Insight into benefits of groups and therapy.   Goal: Interventions  Outcome: Progressing Towards Goal  Staff focus is on limit setting, offering support and coping skills education

## 2016-07-25 NOTE — Behavioral Health Treatment Team (Signed)
PRN Medication Documentation    Specific patient behavior that led to need for PRN medication: anxiety  Staff interventions attempted prior to PRN being given: express feelings  PRN medication given: ativan 1 mg po prn  Patient response/effectiveness of PRN medication:

## 2016-07-25 NOTE — Progress Notes (Addendum)
Problem: Depressed Mood (Adult/Pediatric)  Goal: *STG: Participates in treatment plan  Outcome: Progressing Towards Goal  1530: Greeted patient on unit in dining room attending group.   Appeared in no acute distress.   Will continue to monitor on Q 15 minute safety checks.     1800 Patient alert, vital stable, wnl.   Medication and meal compliant.   Patient denies SI at the present time and continues to contract for safety.     Patient remains ambulatory with minimal assist.   Attended reflections group.  Some hyperverbal ,  inappropriate conversation during reflections.  Irritable with medication nurse concerning flu shot.  Will continue to monitor on Q 15 minute safety checks.

## 2016-07-26 LAB — GLUCOSE, POC
Glucose (POC): 100 mg/dL (ref 65–100)
Glucose (POC): 93 mg/dL (ref 65–100)

## 2016-07-26 MED ORDER — ARIPIPRAZOLE 5 MG TAB
5 mg | Freq: Every day | ORAL | Status: DC
Start: 2016-07-26 — End: 2016-07-29
  Administered 2016-07-27 – 2016-07-29 (×3): via ORAL

## 2016-07-26 MED ORDER — DULOXETINE 20 MG CAP, DELAYED RELEASE
20 mg | Freq: Every day | ORAL | Status: DC
Start: 2016-07-26 — End: 2016-07-29
  Administered 2016-07-27 – 2016-07-29 (×3): via ORAL

## 2016-07-26 MED FILL — LYRICA 100 MG CAPSULE: 100 mg | ORAL | Qty: 1

## 2016-07-26 MED FILL — DULOXETINE 30 MG CAP, DELAYED RELEASE: 30 mg | ORAL | Qty: 1

## 2016-07-26 MED FILL — FLU VACCINE QV 2017-18 (36 MOS+)(PF) 60 MCG (15 MCG X 4)/0.5 ML IM SYRINGE: 60 mcg (15 mcg x 4)/0.5 mL | INTRAMUSCULAR | Qty: 0.5

## 2016-07-26 MED FILL — HYDROCHLOROTHIAZIDE 25 MG TAB: 25 mg | ORAL | Qty: 1

## 2016-07-26 MED FILL — METFORMIN SR 500 MG 24 HR TABLET: 500 mg | ORAL | Qty: 1

## 2016-07-26 MED FILL — DILTIAZEM ER 240 MG 24 HR CAP: 240 mg | ORAL | Qty: 1

## 2016-07-26 MED FILL — ARIPIPRAZOLE 10 MG TAB: 10 mg | ORAL | Qty: 1

## 2016-07-26 MED FILL — LISINOPRIL 20 MG TAB: 20 mg | ORAL | Qty: 2

## 2016-07-26 MED FILL — DULOXETINE 20 MG CAP, DELAYED RELEASE: 20 mg | ORAL | Qty: 1

## 2016-07-26 MED FILL — INSULIN LISPRO 100 UNIT/ML INJECTION: 100 unit/mL | SUBCUTANEOUS | Qty: 1

## 2016-07-26 MED FILL — MONTELUKAST 10 MG TAB: 10 mg | ORAL | Qty: 1

## 2016-07-26 MED FILL — CLONIDINE 0.1 MG TAB: 0.1 mg | ORAL | Qty: 1

## 2016-07-26 NOTE — Progress Notes (Signed)
Problem: Falls - Risk of  Goal: *Absence of Falls  Document Schmid Fall Risk and appropriate interventions in the flowsheet.   Outcome: Progressing Towards Goal  Fall Risk Interventions:  Mobility Interventions: Utilize walker, cane, or other assitive device       Medication Interventions: Teach patient to arise slowly    Elimination Interventions: Bed/chair exit alarm    History of Falls Interventions: Door open when patient unattended    Pt in bed asleep. NAD. No falls noted/reported. Q 15 minute checks for safety maintained.

## 2016-07-26 NOTE — Behavioral Health Treatment Team (Signed)
PRN Medication Documentation    Specific patient behavior that led to need for PRN medication: C/o Insomnia  Staff interventions attempted prior to PRN being given: Education, Quieted Milieu, dimmed lights in room  PRN medication given: Ambien 10 mg po at 2150  Patient response/effectiveness of PRN medication: Pt  Sitting on couch in dayroom watching television at 2225.

## 2016-07-26 NOTE — Progress Notes (Signed)
Problem: Depressed Mood (Adult/Pediatric)  Goal: *STG: Participates in treatment plan  Outcome: Progressing Towards Goal  Review meds, out on unit social w peers. Mood and affect stable. Denies SI, no self harming behaviors. Demonstrates appropriate behaviors, ability to follow staff direction and get needs met. Compliant with meds and groups. Daily goal is to review meds w tx team. Staff focus is on d/c planning

## 2016-07-26 NOTE — Behavioral Health Treatment Team (Cosign Needed)
Patient di d not attend group.

## 2016-07-26 NOTE — Behavioral Health Treatment Team (Signed)
GROUP THERAPY PROGRESS NOTE    Clarence Rodriguez is participating in Reflections  Ttme: 30 minutest  Personal goal for participation: daily progress    Goal orientation: social    Group therapy participation: active    Therapeutic interventions reviewed and discussed:     Impression of participation: The patient emerged as a leader of the group.

## 2016-07-26 NOTE — Progress Notes (Signed)
Problem: Depressed Mood (Adult/Pediatric)  Goal: *STG: Attends activities and groups  Outcome: Progressing Towards Goal  1515 Received patient watching TV in DR. Greeted staff with a smile. NAD. On q 15 min.safety checks.   VS taken and WNL.  HOH. Requested and assisted with putting in new batteries in Hearing Aids.  Diabetic with BID POC checks.  BS was 93 before dinner.   Ate 100% dinner and fluids.   Attended AA/NA Group and Reflections Group  TDO Hearing scheduled for Sat.at 0730 with [place].   Med and meal compliant.       Problem: Falls - Risk of  Goal: *Absence of Falls  Document Schmid Fall Risk and appropriate interventions in the flowsheet.   Outcome: Progressing Towards Goal  Fall Risk Interventions:  Mobility Interventions: Utilize walker, cane, or other assitive device         Medication Interventions: Teach patient to arise slowly    Elimination Interventions: Bed/chair exit alarm    History of Falls Interventions: Door open when patient unattended, Room close to nurse's station     Patient has not experienced any falls on this shift.  A & O x 4.  Wears gripper socks.  Ambulates with walker.  Able to verbalize needs to staff.

## 2016-07-26 NOTE — Other (Addendum)
Behavioral Health Interdisciplinary Rounds     Patient Name: Clarence Rodriguez  Age: 41 y.o.  Room/Bed:  733/01  Primary Diagnosis: Bipolar 1 disorder (HCC)   Admission Status: TDO--Hearing on Saturday at 0730 with [place]     Readmission within 30 days: no  Power of Attorney in place: no  Patient requires a blocked bed: no          Reason for blocked bed: na    VTE Prophylaxis: No  Flu vaccine given : yes --07/25/16  Mobility needs/Fall risk: yes    Nutritional Plan: no  Consults:          Labs/Testing due today?: no    Sleep hours:  4+ (not counting 1.5 hr nap from 1900-2030; broken sleep)     Participation in Care/Groups:  yes  Medication Compliant?: Yes  PRNS (last 24 hours): Antianxiety    Restraints (last 24 hours):  no  Substance Abuse:  no  CIWA (range last 24 hours):  COWS (range last 24 hours):   Alcohol screening (AUDIT) completed -  AUDIT Score: 0  If applicable, date SBIRT discussed in treatment team AND documented: na  Tobacco - patient is a smoker: yes   Date tobacco education completed by RN: 07/24/16  24 hour chart check complete: yes     Patient goal(s) for today: Utilize coping skills; work on [employer]  Treatment team focus/goals: Print out bus schedule for Pt; discuss D/C plan [place] [facility]  Progress note: Patient doing well in the hospital; attention-seeking at times.     LOS:  2  Expected LOS: TBD    Financial concerns/prescription coverage:  [employer] Medicare  Date of last family contact: None      Family requesting physician contact today:  no  Dis[place]  Guns in the home: no        Outpatient provider(s): TBD    Participating treatment team members: Clarence Rodriguez, Valaria GoodKatherine Wallace, MSW; Dr. Mabeline CarasHaine, MD; Sheldon SilvanJessica Lurey, RN; Harless LittenAllie Smith, PharmD; [facility]; Pharmacy student

## 2016-07-26 NOTE — Behavioral Health Treatment Team (Signed)
GROUP THERAPY PROGRESS NOTE    Clarence PilgrimJoshua Rodriguez participated in the General Unit's Process Group, with a focus on setting a direction or goal, identifying feelings and planning for the day.     Group time: 65 minutes.     Personal goal for participation: To increase the capacity to improve one???s mood and structure.     Goal orientation: The patient will be able to identify a direction or goal for themselves, identify their feelings, develop a plan for structuring their day, and continue working on a discharge planning.     Group therapy participation: With prompting, this patient partially participated in the group.      Therapeutic interventions reviewed and discussed: The group members were asked to introduce themselves to each other and to see if they could identify an emotion they are having and/or let the group know what they want to focus on for the day as they continue to make discharge plans. They were also asked to read a list of twenty-five suggestions for living and to pick one for themselves today.      Impression of participation: The patient came to group about ten minutes after it started. He said, "I'm having a rough morning.Marland Kitchen.Marland Kitchen.I wonder if I'm taking too much medicine." He also admitted that he does not feel as depressed as he was on admission; he expressed no current SI/HI and displayed no overt psychotic symptoms. He was alert and generally oriented. He indicated that his affect was anxious, in part because he did not know whether he would be living back in the [place] area or in the [place] area, what are the costs of bus tickets here in [place], etc. His mood reflect his affect. In his interactions during group, he continued to be intrusive with his peers, wanting to voice more of his personal concerns rather than listening to his peers. He was redirected a couple of times. He did share that he had "a couple of children" killed by  his first wife. He did not express remorse or cry when discussing this event! He added that he did not think he is gay but that "I'm uncertain about whether I'm gay or straight."  He also expressed a desire to return to live with his second wife, "married in 2013," and their children. "I'm uncertain about whether I'm gay or straight." He left the group before sharing his selections from the handout.

## 2016-07-26 NOTE — Progress Notes (Signed)
Laboratory Monitoring for Antipsychotics:    This patient is currently prescribed the following medication(s):   Current Facility-Administered Medications   Medication Dose Route Frequency   ??? ARIPiprazole (ABILIFY) tablet 10 mg  10 mg Oral DAILY   ??? DULoxetine (CYMBALTA) capsule 30 mg  30 mg Oral DAILY   ??? cloNIDine HCl (CATAPRES) tablet 0.1 mg  0.1 mg Oral BID   ??? dilTIAZem CD (CARDIZEM CD) capsule 240 mg  240 mg Oral DAILY   ??? lisinopril (PRINIVIL, ZESTRIL) tablet 40 mg  40 mg Oral DAILY   ??? montelukast (SINGULAIR) tablet 10 mg  10 mg Oral QHS   ??? pregabalin (LYRICA) capsule 100 mg  100 mg Oral BID   ??? metFORMIN ER (GLUCOPHAGE XR) tablet 500 mg  500 mg Oral ACB   ??? insulin lispro (HUMALOG) injection   SubCUTAneous ACB&D   ??? hydroCHLOROthiazide (HYDRODIURIL) tablet 25 mg  25 mg Oral DAILY   ??? mupirocin (BACTROBAN) 2 % ointment   Topical BID     The following labs have been completed for monitoring of antipsychotics and/or mood stabilizers:    Height, Weight, BMI Estimation  Estimated body mass index is 55.23 kg/(m^2) as calculated from the following:    Height as of this encounter: 180.3 cm (71").    Weight as of this encounter: 179.6 kg (396 lb).     Vital Signs/Blood Pressure  Visit Vitals   ??? BP 136/80 (BP Patient Position: Sitting)   ??? Pulse 93   ??? Temp 98.1 ??F (36.7 ??C)   ??? Resp 16   ??? Ht 180.3 cm (71")   ??? Wt (!) 179.6 kg (396 lb)   ??? SpO2 97%   ??? BMI 55.23 kg/m2     Renal Function, Hepatic Function and Chemistry  Estimated Creatinine Clearance: 180.4 mL/min (based on Cr of 0.91).    Lab Results   Component Value Date/Time    Sodium 137 07/25/2016 06:36 AM    Potassium 3.9 07/25/2016 06:36 AM    Chloride 100 07/25/2016 06:36 AM    CO2 31 07/25/2016 06:36 AM    Anion gap 6 07/25/2016 06:36 AM    BUN 10 07/25/2016 06:36 AM    Creatinine 0.91 07/25/2016 06:36 AM    BUN/Creatinine ratio 11 (L) 07/25/2016 06:36 AM    Bilirubin, total 0.5 07/25/2016 06:36 AM    Protein, total 7.8 07/25/2016 06:36 AM     Albumin 3.3 (L) 07/25/2016 06:36 AM    Globulin 4.5 (H) 07/25/2016 06:36 AM    A-G Ratio 0.7 (L) 07/25/2016 06:36 AM    ALT (SGPT) 37 07/25/2016 06:36 AM    Alk. phosphatase 95 07/25/2016 06:36 AM     Lab Results   Component Value Date/Time    Glucose 97 07/25/2016 06:36 AM    Glucose 94 07/25/2016 06:36 AM    Glucose (POC) 100 07/26/2016 07:19 AM     No results found for: HBA1C, HGBE8, HBA1CEXT    Hematology  Lab Results   Component Value Date/Time    WBC 7.6 07/21/2016 03:30 PM    RBC 4.41 (L) 07/21/2016 03:30 PM    HGB 12.9 (L) 07/21/2016 03:30 PM    HCT 40.8 07/21/2016 03:30 PM    MCV 92.5 07/21/2016 03:30 PM    MCH 29.3 07/21/2016 03:30 PM    MCHC 31.6 07/21/2016 03:30 PM    RDW 14.7 (H) 07/21/2016 03:30 PM    PLATELET 218 07/21/2016 03:30 PM     Lipids  Lab  Results   Component Value Date/Time    Cholesterol, total 107 07/25/2016 06:36 AM    HDL Cholesterol 36 07/25/2016 06:36 AM    LDL, calculated 37 07/25/2016 06:36 AM    Triglyceride 170 (H) 07/25/2016 06:36 AM    CHOL/HDL Ratio 3.0 07/25/2016 06:36 AM     Thyroid Function  Lab Results   Component Value Date/Time    TSH 2.38 07/25/2016 06:36 AM     Assessment/Plan:  Recommended baseline laboratory monitoring has been completed based on this patient's current medication regimen.     Patrick Jupiter, PharmD, BCPP, [place] [facility], [place]

## 2016-07-27 LAB — GLUCOSE, POC
Glucose (POC): 101 mg/dL — ABNORMAL HIGH (ref 65–100)
Glucose (POC): 134 mg/dL — ABNORMAL HIGH (ref 65–100)

## 2016-07-27 MED FILL — METFORMIN SR 500 MG 24 HR TABLET: 500 mg | ORAL | Qty: 1

## 2016-07-27 MED FILL — ZOLPIDEM 10 MG TAB: 10 mg | ORAL | Qty: 1

## 2016-07-27 MED FILL — ARIPIPRAZOLE 5 MG TAB: 5 mg | ORAL | Qty: 1

## 2016-07-27 MED FILL — DULOXETINE 20 MG CAP, DELAYED RELEASE: 20 mg | ORAL | Qty: 1

## 2016-07-27 MED FILL — CLONIDINE 0.1 MG TAB: 0.1 mg | ORAL | Qty: 1

## 2016-07-27 MED FILL — HYDROCHLOROTHIAZIDE 25 MG TAB: 25 mg | ORAL | Qty: 1

## 2016-07-27 MED FILL — MONTELUKAST 10 MG TAB: 10 mg | ORAL | Qty: 1

## 2016-07-27 MED FILL — NICOTINE 21 MG/24 HR DAILY PATCH: 21 mg/24 hr | TRANSDERMAL | Qty: 1

## 2016-07-27 MED FILL — LISINOPRIL 20 MG TAB: 20 mg | ORAL | Qty: 2

## 2016-07-27 MED FILL — LYRICA 100 MG CAPSULE: 100 mg | ORAL | Qty: 1

## 2016-07-27 MED FILL — DILTIAZEM ER 240 MG 24 HR CAP: 240 mg | ORAL | Qty: 1

## 2016-07-27 NOTE — Progress Notes (Addendum)
Tylenol, 2 tabs given for c/o HA.    2211 Pt denies further c/o pain.

## 2016-07-27 NOTE — Progress Notes (Signed)
Problem: Depressed Mood (Adult/Pediatric)  Goal: *STG: Participates in treatment plan  Outcome: Progressing Towards Goal  Patient denies SI. Med and meal compliant. Patient had TDO hearing today and was involuntarily committed. Patient goal: "to talk with the doctor about being discharged to a crisis stabilization center [place]. Staff goal: to teach coping skills.     Treatment team:     Discussed plan of care. Discussed pt response to meds. Patient said I feel less agitated and more calm. Denies SI.     Goal: *STG: Attends activities and groups  Outcome: Progressing Towards Goal  Attends groups.   Goal: Interventions  Outcome: Progressing Towards Goal  Medication management. Q 15 min safety rounds. Group therapy.

## 2016-07-27 NOTE — Progress Notes (Signed)
Problem: Falls - Risk of  Goal: *Absence of Falls  Document Schmid Fall Risk and appropriate interventions in the flowsheet.   Outcome: Progressing Towards Goal  Fall Risk Interventions:  Mobility Interventions: Utilize walker, cane, or other assitive device         Medication Interventions: Teach patient to arise slowly    Elimination Interventions: Bed/chair exit alarm    History of Falls Interventions: Door open when patient unattended    Pt in bed asleep. NAD. No falls noted/reported. Q 15 minute checks for safety maintained.

## 2016-07-27 NOTE — Other (Addendum)
Behavioral Health Interdisciplinary Rounds     Patient Name: Clarence Rodriguez  Age: 41 y.o.  Room/Bed:  733/01  Primary Diagnosis: Bipolar 1 disorder (HCC)   Admission Status: IC     Readmission within 30 days: no  Power of Attorney in place: no  Patient requires a blocked bed: no          Reason for blocked bed: n/a    VTE Prophylaxis: No  Flu vaccine given : yes -07/25/16  Mobility needs/Fall risk: yes    Nutritional Plan: no  Consults:          Labs/Testing due today?: no    Sleep hours: 7.0       Participation in Care/Groups:  yes  Medication Compliant?:Yes - Refused Bactroban Ointment  PRNS (last 24 hours): Sleep Aid    Restraints (last 24 hours):  no  Substance Abuse:  no  CIWA (range last 24 hours):  COWS (range last 24 hours):   Alcohol screening (AUDIT) completed -  AUDIT Score: 0  If applicable, date SBIRT discussed in treatment team AND documented:   Tobacco - patient is a smoker: yes   Date tobacco education completed by RN: 07/24/16  24 hour chart check complete: yes     Patient goal(s) for today: Discuss disposition of TDO and involuntary commitment  Treatment team focus/goals: Eval meds and address attempt to address TDO process  Progress note: Alert, pleasant but upset that the judge placed him under IC. Pt told Team that the meds have made him feel calmer and less agitated.   SW suggested pt discuss with team on Monday discharge planning.     LOS:  3  Expected LOS:     Financial concerns/prescription coverage:  Medicare  Date of last family contact:       Family requesting physician contact today:    Discharge plan: TBD  Guns in the home: n/a      Outpatient provider(s): TBD    Participating treatment team members: Clarence Rodriguez, * (assigned SW),

## 2016-07-27 NOTE — Behavioral Health Treatment Team (Signed)
Pt verbalized  concern about the need for a CPAP Machine due to sleep apnea. Also emphasized not wanting to take Ambien unless CPAP Machine was obtained.    Pt informed that request for CPAPwould be passed on to MD. Pt educated reference PRN Medication (Ambien). Informed pt  PRN Ambien would not be administered unless requested. Pt acknowledged understanding of this information.

## 2016-07-27 NOTE — Behavioral Health Treatment Team (Signed)
PSYCHIATRIC PROGRESS NOTE         Patient Name  Clarence PilgrimJoshua Rodriguez   Date of Birth 1976/02/17   CSN [id number]   Medical Record Number  [id number]      Age  41 y.o.   PCP None   Admit date:  07/24/2016    Room Number  733/01  @ [facility]   Date of Service  07/27/2016          PSYCHOTHERAPY SESSION NOTE:  Length of psychotherapy session: 45 minutes    Main condition/diagnosis/issues treated during session today, 07/27/2016 : coping skills, anxiety management    I employed Cognitive Behavioral therapy techniques, Reality-Oriented psychotherapy, as well as supportive psychotherapy in regards to various ongoing psychosocial stressors, including the following: pre-admission and current problems; housing issues; occupational issues; medical issues; and stress of hospitalization. Interpersonal relationship issues and psychodynamic conflicts explored.  Attempts made to alleviate maladaptive patterns. We, also, worked on issues of denial & effects of substance dependency/use     Overall, patient is not progressing    Treatment Plan Update (reviewed an updated 07/27/2016) :  I will modify psychotherapy tx plan by implementing more stress management strategies, building upon cognitive behavioral techniques, increasing coping skills, as well as shoring up psychological defenses).    An extended energy and skill set was needed to engage pt in psychotherapy due to some of the following: resistiveness, complexity, negativity, confrontational nature, hostile behaviors, and/or severe abnormalities in thought processes/psychosis resulting in the loss of expressive/receptive language communication skills.                            E & M PROGRESS NOTE:         HISTORY       CC:  "SI depression "  HISTORY OF PRESENT ILLNESS/INTERVAL HISTORY:  (reviewed/updated 07/27/2016).  per initial evaluation:     Clarence PilgrimJoshua Rodriguez presents/reports/evidences the following emotional  symptoms today, 07/27/2016:depression. The above symptoms have been present for 3 months . These symptoms are of severe severity. The symptoms are intermittent/ fleeting in nature. Additional symptomatology and features include anxiety.   08/04/16- continues deptressed and anxious about being homeless. Participating in groups      SIDE EFFECTS: (reviewed/updated 07/27/2016)  None reported or admitted to.  No noted toxicity with use of Depakote/Tegretol/lithium/Clozaril/TCAs   ALLERGIES:(reviewed/updated 07/27/2016)  No Known Allergies   MEDICATIONS PRIOR TO ADMISSION:(reviewed/updated 07/27/2016)  Prescriptions Prior to Admission   Medication Sig   ??? ARIPiprazole (ABILIFY) 15 mg tablet Take 15 mg by mouth daily.   ??? divalproex DR (DEPAKOTE) 250 mg tablet Take 750 mg by mouth two (2) times a day.   ??? dilTIAZem ER (CARDIZEM LA) 240 mg Tb24 tablet Take 240 mg by mouth daily.   ??? ibuprofen 200 mg cap Take  by mouth.   ??? pregabalin (LYRICA) 100 mg capsule Take 100 mg by mouth three (3) times daily.   ??? metFORMIN (GLUCOPHAGE) 500 mg tablet Take 500 mg by mouth two (2) times daily (with meals).   ??? montelukast (SINGULAIR) 10 mg tablet Take 10 mg by mouth nightly.   ??? lisinopril (PRINIVIL, ZESTRIL) 40 mg tablet Take 40 mg by mouth daily.   ??? cloNIDine HCl (CATAPRES) 0.1 mg tablet Take  by mouth two (2) times a day.      PAST MEDICAL HISTORY: Past medical history from the initial psychiatric evaluation has been reviewed (reviewed/updated 07/27/2016) with no additional updates (  I asked patient and no additional past medical history provided).   Past Medical History:   Diagnosis Date   ??? Aggressive outburst    ??? Diabetes (HCC)    ??? Hypertension    ??? Mood disorder (HCC)    ??? Sleep disorder    ??? Suicidal thoughts    History reviewed. No pertinent surgical history.   SOCIAL HISTORY: Social history from the initial psychiatric evaluation has been reviewed (reviewed/updated 07/27/2016) with no additional updates (I  asked patient and no additional social history provided).   Social History     Social History   ??? Marital status: SINGLE     Spouse name: N/A   ??? Number of children: N/A   ??? Years of education: N/A     Occupational History   ??? Not on file.     Social History Main Topics   ??? Smoking status: Current Every Day Smoker   ??? Smokeless tobacco: Never Used   ??? Alcohol use No   ??? Drug use: No   ??? Sexual activity: Not on file     Other Topics Concern   ??? Not on file     Social History Narrative    41 year old caucasian morbidly obese amle admitted on TDO for SI depression, in the context of being homeless after breaking up with hhis partner.Pt is diabetic and is non compliant with treatment,      FAMILY HISTORY: Family history from the initial psychiatric evaluation has been reviewed (reviewed/updated 07/27/2016) with no additional updates (I asked patient and no additional family history provided). History reviewed. No pertinent family history.    REVIEW OF SYSTEMS: (reviewed/updated 07/27/2016)  Appetite:no change from normal   Sleep: does not feel rested   All other Review of Systems: Psychological ROS: positive for - behavioral disorder  Respiratory ROS: no cough, shortness of breath, or wheezing  Cardiovascular ROS: no chest pain or dyspnea on exertion         MENTAL STATUS EXAM & VITALS     MENTAL STATUS EXAM (MSE):    MSE FINDINGS ARE WITHIN NORMAL LIMITS (WNL) UNLESS OTHERWISE STATED BELOW. ( ALL OF THE BELOW CATEGORIES OF THE MSE HAVE BEEN REVIEWED (reviewed 07/27/2016) AND UPDATED AS DEEMED APPROPRIATE )  General Presentation age appropriate, cooperative   Orientation oriented to time, place and person   Vital Signs  See below (reviewed 07/27/2016); Vital Signs (BP, Pulse, & Temp) are within normal limits if not listed below.   Gait and Station Stable/steady, no ataxia   Musculoskeletal System No extrapyramidal symptoms (EPS); no abnormal muscular movements or Tardive Dyskinesia (TD); muscle strength and tone  are within normal limits   Language No aphasia or dysarthria   Speech:  hyperverbal   Thought Processes logical; normal rate of thoughts; poor abstract reasoning/computation   Thought Associations circumstantial   Thought Content free of delusions   Suicidal Ideations contracts for safety   Homicidal Ideations none   Mood:  irritable   Affect:  mood-congruent   Memory recent  fair   Memory remote:  fair   Concentration/Attention:  distractable   Fund of Knowledge average   Insight:  poor   Reliability poor   Judgment:  poor          VITALS:     Patient Vitals for the past 24 hrs:   Temp Pulse Resp BP SpO2   07/26/16 2115 98.5 ??F (36.9 ??C) 83 16 139/88 95 %   07/26/16 1538  97.8 ??F (36.6 ??C) 93 16 117/75 97 %     Wt Readings from Last 3 Encounters:   07/24/16 (!) 179.6 kg (396 lb)   07/21/16 (!) 179.6 kg (396 lb)     Temp Readings from Last 3 Encounters:   07/26/16 98.5 ??F (36.9 ??C)   07/24/16 97.7 ??F (36.5 ??C)     BP Readings from Last 3 Encounters:   07/26/16 139/88   07/24/16 (!) 157/92     Pulse Readings from Last 3 Encounters:   07/26/16 83   07/24/16 84            DATA     LABORATORY DATA:(reviewed/updated 07/27/2016)  Recent Results (from the past 24 hour(s))   GLUCOSE, POC    Collection Time: 07/26/16  4:51 PM   Result Value Ref Range    Glucose (POC) 93 65 - 100 mg/dL    Performed by [facility] (PCT)    GLUCOSE, POC    Collection Time: 07/27/16  7:41 AM   Result Value Ref Range    Glucose (POC) 101 (H) 65 - 100 mg/dL    Performed by Howell Pringle      Lab Results   Component Value Date/Time    Valproic acid 37 (L) 07/25/2016 06:36 AM     No results found for: LITHM   RADIOLOGY REPORTS:(reviewed/updated 07/27/2016)  No results found.       MEDICATIONS     ALL MEDICATIONS:   Current Facility-Administered Medications   Medication Dose Route Frequency   ??? ARIPiprazole (ABILIFY) tablet 5 mg  5 mg Oral DAILY   ??? DULoxetine (CYMBALTA) capsule 20 mg  20 mg Oral DAILY    ??? loperamide (IMODIUM) capsule 2 mg  2 mg Oral Q4H PRN   ??? ziprasidone (GEODON) 20 mg in sterile water (preservative free) 1 mL injection  20 mg IntraMUSCular BID PRN   ??? OLANZapine (ZyPREXA) tablet 5 mg  5 mg Oral Q6H PRN   ??? benztropine (COGENTIN) tablet 2 mg  2 mg Oral BID PRN   ??? benztropine (COGENTIN) injection 2 mg  2 mg IntraMUSCular BID PRN   ??? LORazepam (ATIVAN) injection 2 mg  2 mg IntraMUSCular Q4H PRN   ??? LORazepam (ATIVAN) tablet 1 mg  1 mg Oral Q4H PRN   ??? zolpidem (AMBIEN) tablet 10 mg  10 mg Oral QHS PRN   ??? acetaminophen (TYLENOL) tablet 650 mg  650 mg Oral Q4H PRN   ??? ibuprofen (MOTRIN) tablet 400 mg  400 mg Oral Q8H PRN   ??? magnesium hydroxide (MILK OF MAGNESIA) 400 mg/5 mL oral suspension 30 mL  30 mL Oral DAILY PRN   ??? nicotine (NICODERM CQ) 21 mg/24 hr patch 1 Patch  1 Patch TransDERmal DAILY PRN   ??? cloNIDine HCl (CATAPRES) tablet 0.1 mg  0.1 mg Oral BID   ??? dilTIAZem CD (CARDIZEM CD) capsule 240 mg  240 mg Oral DAILY   ??? lisinopril (PRINIVIL, ZESTRIL) tablet 40 mg  40 mg Oral DAILY   ??? montelukast (SINGULAIR) tablet 10 mg  10 mg Oral QHS   ??? pregabalin (LYRICA) capsule 100 mg  100 mg Oral BID   ??? metFORMIN ER (GLUCOPHAGE XR) tablet 500 mg  500 mg Oral ACB   ??? insulin lispro (HUMALOG) injection   SubCUTAneous ACB&D   ??? glucose chewable tablet 16 g  4 Tab Oral PRN   ??? dextrose (D50W) injection syrg 12.5-25 g  12.5-25 g IntraVENous PRN   ???  glucagon (GLUCAGEN) injection 1 mg  1 mg IntraMUSCular PRN   ??? hydroCHLOROthiazide (HYDRODIURIL) tablet 25 mg  25 mg Oral DAILY   ??? mupirocin (BACTROBAN) 2 % ointment   Topical BID      SCHEDULED MEDICATIONS:   Current Facility-Administered Medications   Medication Dose Route Frequency   ??? ARIPiprazole (ABILIFY) tablet 5 mg  5 mg Oral DAILY   ??? DULoxetine (CYMBALTA) capsule 20 mg  20 mg Oral DAILY   ??? cloNIDine HCl (CATAPRES) tablet 0.1 mg  0.1 mg Oral BID   ??? dilTIAZem CD (CARDIZEM CD) capsule 240 mg  240 mg Oral DAILY    ??? lisinopril (PRINIVIL, ZESTRIL) tablet 40 mg  40 mg Oral DAILY   ??? montelukast (SINGULAIR) tablet 10 mg  10 mg Oral QHS   ??? pregabalin (LYRICA) capsule 100 mg  100 mg Oral BID   ??? metFORMIN ER (GLUCOPHAGE XR) tablet 500 mg  500 mg Oral ACB   ??? insulin lispro (HUMALOG) injection   SubCUTAneous ACB&D   ??? hydroCHLOROthiazide (HYDRODIURIL) tablet 25 mg  25 mg Oral DAILY   ??? mupirocin (BACTROBAN) 2 % ointment   Topical BID          ASSESSMENT & PLAN     DIAGNOSES REQUIRING ACTIVE TREATMENT AND MONITORING: (reviewed/updated 07/27/2016)  Patient Active Hospital Problem List:   Bipolar 1 disorder (HCC) (07/24/2016)    Assessment: does not present with mania-  Depressed    Plan: antidepressant - consider mood stabilizer            I will continue to monitor blood levels (Depakote, Tegretol, lithium, clozapine---a drug with a narrow therapeutic index= NTI) and associated labs for drug therapy implemented that require intense monitoring for toxicity as deemed appropriate based on current medication side effects and pharmacodynamically determined drug 1/2 lives.    In summary, Ege Muckey, is a 41 y.o.  male who presents with a severe exacerbation of the principal diagnosis of Bipolar 1 disorder (HCC)  Patient's condition is worsening/not improving/not stable improving.  Patient requires continued inpatient hospitalization for further stabilization, safety monitoring and medication management.  I will continue to coordinate the provision of individual, milieu, occupational, group, and substance abuse therapies to address target symptoms/diagnoses as deemed appropriate for the individual patient.  A coordinated, multidisplinary treatment team round was conducted with the patient (this team consists of the nurse, psychiatric unit pharmcist, Administrator).     Complete current electronic health record for patient has been reviewed today including consultant notes, ancillary staff notes, nurses and  psychiatric tech notes.    Suicide risk assessment completed and patient deemed to be of low risk for suicide at this time.     The following regarding medications was addressed during rounds with patient:   the risks and benefits of the proposed medication. The patient was given the opportunity to ask questions. Informed consent given to the use of the above medications. Will continue to adjust psychiatric and non-psychiatric medications (see above "medication" section and orders section for details) as deemed appropriate & based upon diagnoses and response to treatment.     I will continue to order blood tests/labs and diagnostic tests as deemed appropriate and review results as they become available (see orders for details and above listed lab/test results).    I will order psychiatric records from previous psych hospitals to further elucidate the nature of patient's psychopathology and review once available.    I will gather additional collateral  information from friends, family and o/p treatment team to further elucidate the nature of patient's psychopathology and baselline level of psychiatric functioning.         I certify that this patient's inpatient psychiatric hospital services furnished since the previous certification were, and continue to be, required for treatment that could reasonably be expected to improve the patient's condition, or for diagnostic study, and that the patient continues to need, on a daily basis, active treatment furnished directly by or requiring the supervision of inpatient psychiatric facility personnel. In addition, the hospital records show that services furnished were intensive treatment services, admission or related services, or equivalent services.    EXPECTED DISCHARGE DATE/DAY: TBD     DISPOSITION: Home       Signed By:   Cheryll Cockayne, MD  07/27/2016

## 2016-07-27 NOTE — Progress Notes (Signed)
Problem: Depressed Mood (Adult/Pediatric)  Goal: *STG: Participates in treatment plan  Outcome: Progressing Towards Goal  Pt participates in all therapeutic activities.

## 2016-07-27 NOTE — Behavioral Health Treatment Team (Cosign Needed)
GROUP THERAPY PROGRESS NOTE    Clarence Rodriguez is participating in Goals Group.     Group time: 15 minutes    Personal goal for participation: Review goals accomplished today         Goal orientation: community    Group therapy participation: active    Therapeutic interventions reviewed and discussed: Review goals accomplished today      Impression of participation: active in group discussion

## 2016-07-28 LAB — GLUCOSE, POC
Glucose (POC): 109 mg/dL — ABNORMAL HIGH (ref 65–100)
Glucose (POC): 93 mg/dL (ref 65–100)

## 2016-07-28 MED FILL — MUPIROCIN 2 % OINTMENT: 2 % | CUTANEOUS | Qty: 22

## 2016-07-28 MED FILL — NICOTINE 21 MG/24 HR DAILY PATCH: 21 mg/24 hr | TRANSDERMAL | Qty: 1

## 2016-07-28 MED FILL — METFORMIN SR 500 MG 24 HR TABLET: 500 mg | ORAL | Qty: 1

## 2016-07-28 MED FILL — LYRICA 100 MG CAPSULE: 100 mg | ORAL | Qty: 1

## 2016-07-28 MED FILL — MONTELUKAST 10 MG TAB: 10 mg | ORAL | Qty: 1

## 2016-07-28 MED FILL — ZOLPIDEM 10 MG TAB: 10 mg | ORAL | Qty: 1

## 2016-07-28 MED FILL — IBUPROFEN 400 MG TAB: 400 mg | ORAL | Qty: 1

## 2016-07-28 MED FILL — CLONIDINE 0.1 MG TAB: 0.1 mg | ORAL | Qty: 1

## 2016-07-28 MED FILL — ARIPIPRAZOLE 5 MG TAB: 5 mg | ORAL | Qty: 1

## 2016-07-28 MED FILL — HYDROCHLOROTHIAZIDE 25 MG TAB: 25 mg | ORAL | Qty: 1

## 2016-07-28 MED FILL — ACETAMINOPHEN 325 MG TABLET: 325 mg | ORAL | Qty: 2

## 2016-07-28 MED FILL — DILTIAZEM ER 240 MG 24 HR CAP: 240 mg | ORAL | Qty: 1

## 2016-07-28 MED FILL — LISINOPRIL 20 MG TAB: 20 mg | ORAL | Qty: 2

## 2016-07-28 MED FILL — DULOXETINE 20 MG CAP, DELAYED RELEASE: 20 mg | ORAL | Qty: 1

## 2016-07-28 NOTE — Progress Notes (Signed)
Problem: Depressed Mood (Adult/Pediatric)  Goal: *STG: Participates in treatment plan  Outcome: Progressing Towards Goal  Pt denies SI. Med and meal compliant

## 2016-07-28 NOTE — Behavioral Health Treatment Team (Addendum)
Pt noted to be snoring loudly throughout the night. Periods of apneic breathing noted.

## 2016-07-28 NOTE — Progress Notes (Signed)
Problem: Depressed Mood (Adult/Pediatric)  Goal: *STG: Participates in treatment plan  Outcome: Not Progressing Towards Goal  Variance: Patient Uncooperative    Pt has been attempting to engage peers in rallying behaviors over food.  Negative attitude and then overflowing with gratuitous apologies to staff. Demanding and manipulative behaviors, unrelated to treatment goals.

## 2016-07-28 NOTE — Behavioral Health Treatment Team (Signed)
The beginning of Daylight Saving Time occurred at 0200 hrs. Documentation of patient care and medications administered is done with respect to the time change.

## 2016-07-28 NOTE — Progress Notes (Signed)
Problem: Falls - Risk of  Goal: *Absence of Falls  Document Schmid Fall Risk and appropriate interventions in the flowsheet.   Outcome: Progressing Towards Goal  Fall Risk Interventions:  Pt received in bed asleep at the start of this shift. NAD. No falls noted/reported. Q 15 minute checks for safety maintained.    Mobility Interventions: Assess mobility with egress test       Medication Interventions: Teach patient to arise slowly    Elimination Interventions: Bed/chair exit alarm    History of Falls Interventions: Door open when patient unattended

## 2016-07-28 NOTE — Progress Notes (Signed)
Respiratory notified of consult for CPAP.  Pt states that he knows his settings.

## 2016-07-28 NOTE — Other (Addendum)
Behavioral Health Interdisciplinary Rounds     Patient Name: Clarence PilgrimJoshua Rodriguez  Age: 41 y.o.  Room/Bed:  733/01  Primary Diagnosis: Bipolar 1 disorder (HCC)   Admission Status: Involuntary Commitment     Readmission within 30 days: no  Power of Attorney in place: no  Patient requires a blocked bed: no          Reason for blocked bed: n/a    VTE Prophylaxis: No  Flu vaccine given : yes - 07/25/16  Mobility needs/Fall risk: yes    Nutritional Plan: no  Consults:          Labs/Testing due today?: no    Sleep hours: 7.5     Participation in Care/Groups:  Yes - Goals Group  Medication Compliant?: Yes  PRNS (last 24 hours): Sleep Aid and Nicoderm Patchand Pain    Restraints (last 24 hours):  no  Substance Abuse:  no  CIWA (range last 24 hours):  COWS (range last 24 hours):   Alcohol screening (AUDIT) completed -  AUDIT Score: 0  If applicable, date SBIRT discussed in treatment team AND documented:   Tobacco - patient is a smoker: yes   Date tobacco education completed by RN: 07/24/16  24 hour chart check complete: yes     Patient goal(s) for today:   Treatment team focus/goals:   Progress note     LOS:  4  Expected LOS:     Financial concerns/prescription coverage:    Date of last family contact:       Family requesting physician contact today:    Discharge plan:   Guns in the home:         Outpatient provider(s):     Participating treatment team members: Clarence PilgrimJoshua Rodriguez, * (assigned SW),

## 2016-07-28 NOTE — Behavioral Health Treatment Team (Signed)
PSYCHIATRIC PROGRESS NOTE         Patient Name  Clarence PilgrimJoshua Rodriguez   Date of Birth Oct 20, 1975   CSN [id number]   Medical Record Number  [id number]      Age  41 y.o.   PCP None   Admit date:  07/24/2016    Room Number  733/01  @ [facility]   Date of Service  07/28/2016          PSYCHOTHERAPY SESSION NOTE:  Length of psychotherapy session: 45 minutes    Main condition/diagnosis/issues treated during session today, 07/28/2016 : coping skills, anxiety management    I employed Cognitive Behavioral therapy techniques, Reality-Oriented psychotherapy, as well as supportive psychotherapy in regards to various ongoing psychosocial stressors, including the following: pre-admission and current problems; housing issues; occupational issues; medical issues; and stress of hospitalization. Interpersonal relationship issues and psychodynamic conflicts explored.  Attempts made to alleviate maladaptive patterns. We, also, worked on issues of denial & effects of substance dependency/use     Overall, patient is not progressing    Treatment Plan Update (reviewed an updated 07/28/2016) :  I will modify psychotherapy tx plan by implementing more stress management strategies, building upon cognitive behavioral techniques, increasing coping skills, as well as shoring up psychological defenses).    An extended energy and skill set was needed to engage pt in psychotherapy due to some of the following: resistiveness, complexity, negativity, confrontational nature, hostile behaviors, and/or severe abnormalities in thought processes/psychosis resulting in the loss of expressive/receptive language communication skills.                            E & M PROGRESS NOTE:         HISTORY       CC:  "SI depression "  HISTORY OF PRESENT ILLNESS/INTERVAL HISTORY:  (reviewed/updated 07/28/2016).  per initial evaluation:     Clarence PilgrimJoshua Rodriguez presents/reports/evidences the following emotional  symptoms today, 07/28/2016:depression. The above symptoms have been present for 3 months . These symptoms are of severe severity. The symptoms are intermittent/ fleeting in nature. Additional symptomatology and features include anxiety.   08/04/16- continues deptressed and anxious about being homeless. Participating in groups  07/28/16- Asked for C-PAP machine because "I lost mine". A consult was placed to respiratory therapy. Continues   With neutral mood and affect.      SIDE EFFECTS: (reviewed/updated 07/28/2016)  None reported or admitted to.  No noted toxicity with use of Depakote/Tegretol/lithium/Clozaril/TCAs   ALLERGIES:(reviewed/updated 07/28/2016)  No Known Allergies   MEDICATIONS PRIOR TO ADMISSION:(reviewed/updated 07/28/2016)  Prescriptions Prior to Admission   Medication Sig   ??? ARIPiprazole (ABILIFY) 15 mg tablet Take 15 mg by mouth daily.   ??? divalproex DR (DEPAKOTE) 250 mg tablet Take 750 mg by mouth two (2) times a day.   ??? dilTIAZem ER (CARDIZEM LA) 240 mg Tb24 tablet Take 240 mg by mouth daily.   ??? ibuprofen 200 mg cap Take  by mouth.   ??? pregabalin (LYRICA) 100 mg capsule Take 100 mg by mouth three (3) times daily.   ??? metFORMIN (GLUCOPHAGE) 500 mg tablet Take 500 mg by mouth two (2) times daily (with meals).   ??? montelukast (SINGULAIR) 10 mg tablet Take 10 mg by mouth nightly.   ??? lisinopril (PRINIVIL, ZESTRIL) 40 mg tablet Take 40 mg by mouth daily.   ??? cloNIDine HCl (CATAPRES) 0.1 mg tablet Take  by mouth two (2) times a day.  PAST MEDICAL HISTORY: Past medical history from the initial psychiatric evaluation has been reviewed (reviewed/updated 07/28/2016) with no additional updates (I asked patient and no additional past medical history provided).   Past Medical History:   Diagnosis Date   ??? Aggressive outburst    ??? Diabetes (HCC)    ??? Hypertension    ??? Mood disorder (HCC)    ??? Sleep disorder    ??? Suicidal thoughts    History reviewed. No pertinent surgical history.    SOCIAL HISTORY: Social history from the initial psychiatric evaluation has been reviewed (reviewed/updated 07/28/2016) with no additional updates (I asked patient and no additional social history provided).   Social History     Social History   ??? Marital status: SINGLE     Spouse name: N/A   ??? Number of children: N/A   ??? Years of education: N/A     Occupational History   ??? Not on file.     Social History Main Topics   ??? Smoking status: Current Every Day Smoker   ??? Smokeless tobacco: Never Used   ??? Alcohol use No   ??? Drug use: No   ??? Sexual activity: Not on file     Other Topics Concern   ??? Not on file     Social History Narrative    41 year old caucasian morbidly obese amle admitted on TDO for SI depression, in the context of being homeless after breaking up with hhis partner.Pt is diabetic and is non compliant with treatment,      FAMILY HISTORY: Family history from the initial psychiatric evaluation has been reviewed (reviewed/updated 07/28/2016) with no additional updates (I asked patient and no additional family history provided). History reviewed. No pertinent family history.    REVIEW OF SYSTEMS: (reviewed/updated 07/28/2016)  Appetite:no change from normal   Sleep: does not feel rested   All other Review of Systems: Psychological ROS: positive for - behavioral disorder  Respiratory ROS: no cough, shortness of breath, or wheezing  Cardiovascular ROS: no chest pain or dyspnea on exertion         MENTAL STATUS EXAM & VITALS     MENTAL STATUS EXAM (MSE):    MSE FINDINGS ARE WITHIN NORMAL LIMITS (WNL) UNLESS OTHERWISE STATED BELOW. ( ALL OF THE BELOW CATEGORIES OF THE MSE HAVE BEEN REVIEWED (reviewed 07/28/2016) AND UPDATED AS DEEMED APPROPRIATE )  General Presentation age appropriate, cooperative   Orientation oriented to time, place and person   Vital Signs  See below (reviewed 07/28/2016); Vital Signs (BP, Pulse, & Temp) are within normal limits if not listed below.   Gait and Station Stable/steady, no ataxia    Musculoskeletal System No extrapyramidal symptoms (EPS); no abnormal muscular movements or Tardive Dyskinesia (TD); muscle strength and tone are within normal limits   Language No aphasia or dysarthria   Speech:  hyperverbal   Thought Processes logical; normal rate of thoughts; poor abstract reasoning/computation   Thought Associations circumstantial   Thought Content free of delusions   Suicidal Ideations contracts for safety   Homicidal Ideations none   Mood:  irritable   Affect:  mood-congruent   Memory recent  fair   Memory remote:  fair   Concentration/Attention:  distractable   Fund of Knowledge average   Insight:  poor   Reliability poor   Judgment:  poor          VITALS:     Patient Vitals for the past 24 hrs:   Temp Pulse  Resp BP SpO2   07/28/16 0811 98 ??F (36.7 ??C) 81 20 118/73 -   07/27/16 2104 97.6 ??F (36.4 ??C) 80 22 154/84 95 %   07/27/16 1654 97.7 ??F (36.5 ??C) 85 20 (!) 168/98 96 %     Wt Readings from Last 3 Encounters:   07/28/16 (!) 200.2 kg (441 lb 4.8 oz)   07/21/16 (!) 179.6 kg (396 lb)     Temp Readings from Last 3 Encounters:   07/28/16 98 ??F (36.7 ??C)   07/24/16 97.7 ??F (36.5 ??C)     BP Readings from Last 3 Encounters:   07/28/16 118/73   07/24/16 (!) 157/92     Pulse Readings from Last 3 Encounters:   07/28/16 81   07/24/16 84            DATA     LABORATORY DATA:(reviewed/updated 07/28/2016)  Recent Results (from the past 24 hour(s))   GLUCOSE, POC    Collection Time: 07/27/16  3:56 PM   Result Value Ref Range    Glucose (POC) 134 (H) 65 - 100 mg/dL    Performed by Katina Degree    GLUCOSE, POC    Collection Time: 07/27/16  8:35 PM   Result Value Ref Range    Glucose (POC) 109 (H) 65 - 100 mg/dL    Performed by Katina Degree      Lab Results   Component Value Date/Time    Valproic acid 37 (L) 07/25/2016 06:36 AM     No results found for: LITHM   RADIOLOGY REPORTS:(reviewed/updated 07/28/2016)  No results found.       MEDICATIONS     ALL MEDICATIONS:    Current Facility-Administered Medications   Medication Dose Route Frequency   ??? ARIPiprazole (ABILIFY) tablet 5 mg  5 mg Oral DAILY   ??? DULoxetine (CYMBALTA) capsule 20 mg  20 mg Oral DAILY   ??? loperamide (IMODIUM) capsule 2 mg  2 mg Oral Q4H PRN   ??? ziprasidone (GEODON) 20 mg in sterile water (preservative free) 1 mL injection  20 mg IntraMUSCular BID PRN   ??? OLANZapine (ZyPREXA) tablet 5 mg  5 mg Oral Q6H PRN   ??? benztropine (COGENTIN) tablet 2 mg  2 mg Oral BID PRN   ??? benztropine (COGENTIN) injection 2 mg  2 mg IntraMUSCular BID PRN   ??? LORazepam (ATIVAN) injection 2 mg  2 mg IntraMUSCular Q4H PRN   ??? LORazepam (ATIVAN) tablet 1 mg  1 mg Oral Q4H PRN   ??? zolpidem (AMBIEN) tablet 10 mg  10 mg Oral QHS PRN   ??? acetaminophen (TYLENOL) tablet 650 mg  650 mg Oral Q4H PRN   ??? ibuprofen (MOTRIN) tablet 400 mg  400 mg Oral Q8H PRN   ??? magnesium hydroxide (MILK OF MAGNESIA) 400 mg/5 mL oral suspension 30 mL  30 mL Oral DAILY PRN   ??? nicotine (NICODERM CQ) 21 mg/24 hr patch 1 Patch  1 Patch TransDERmal DAILY PRN   ??? cloNIDine HCl (CATAPRES) tablet 0.1 mg  0.1 mg Oral BID   ??? dilTIAZem CD (CARDIZEM CD) capsule 240 mg  240 mg Oral DAILY   ??? lisinopril (PRINIVIL, ZESTRIL) tablet 40 mg  40 mg Oral DAILY   ??? montelukast (SINGULAIR) tablet 10 mg  10 mg Oral QHS   ??? pregabalin (LYRICA) capsule 100 mg  100 mg Oral BID   ??? metFORMIN ER (GLUCOPHAGE XR) tablet 500 mg  500 mg Oral ACB   ??? insulin lispro (HUMALOG)  injection   SubCUTAneous ACB&D   ??? glucose chewable tablet 16 g  4 Tab Oral PRN   ??? dextrose (D50W) injection syrg 12.5-25 g  12.5-25 g IntraVENous PRN   ??? glucagon (GLUCAGEN) injection 1 mg  1 mg IntraMUSCular PRN   ??? hydroCHLOROthiazide (HYDRODIURIL) tablet 25 mg  25 mg Oral DAILY   ??? mupirocin (BACTROBAN) 2 % ointment   Topical BID      SCHEDULED MEDICATIONS:   Current Facility-Administered Medications   Medication Dose Route Frequency   ??? ARIPiprazole (ABILIFY) tablet 5 mg  5 mg Oral DAILY    ??? DULoxetine (CYMBALTA) capsule 20 mg  20 mg Oral DAILY   ??? cloNIDine HCl (CATAPRES) tablet 0.1 mg  0.1 mg Oral BID   ??? dilTIAZem CD (CARDIZEM CD) capsule 240 mg  240 mg Oral DAILY   ??? lisinopril (PRINIVIL, ZESTRIL) tablet 40 mg  40 mg Oral DAILY   ??? montelukast (SINGULAIR) tablet 10 mg  10 mg Oral QHS   ??? pregabalin (LYRICA) capsule 100 mg  100 mg Oral BID   ??? metFORMIN ER (GLUCOPHAGE XR) tablet 500 mg  500 mg Oral ACB   ??? insulin lispro (HUMALOG) injection   SubCUTAneous ACB&D   ??? hydroCHLOROthiazide (HYDRODIURIL) tablet 25 mg  25 mg Oral DAILY   ??? mupirocin (BACTROBAN) 2 % ointment   Topical BID          ASSESSMENT & PLAN     DIAGNOSES REQUIRING ACTIVE TREATMENT AND MONITORING: (reviewed/updated 07/28/2016)  Patient Active Hospital Problem List:   Bipolar 1 disorder (HCC) (07/24/2016)    Assessment: does not present with mania-  Depressed    Plan: antidepressant - consider mood stabilizer            I will continue to monitor blood levels (Depakote, Tegretol, lithium, clozapine---a drug with a narrow therapeutic index= NTI) and associated labs for drug therapy implemented that require intense monitoring for toxicity as deemed appropriate based on current medication side effects and pharmacodynamically determined drug 1/2 lives.    In summary, Roark Rufo, is a 41 y.o.  male who presents with a severe exacerbation of the principal diagnosis of Bipolar 1 disorder (HCC)  Patient's condition is worsening/not improving/not stable improving.  Patient requires continued inpatient hospitalization for further stabilization, safety monitoring and medication management.  I will continue to coordinate the provision of individual, milieu, occupational, group, and substance abuse therapies to address target symptoms/diagnoses as deemed appropriate for the individual patient.  A coordinated, multidisplinary treatment team round was conducted with the patient (this  team consists of the nurse, psychiatric unit pharmcist, Administrator).     Complete current electronic health record for patient has been reviewed today including consultant notes, ancillary staff notes, nurses and psychiatric tech notes.    Suicide risk assessment completed and patient deemed to be of low risk for suicide at this time.     The following regarding medications was addressed during rounds with patient:   the risks and benefits of the proposed medication. The patient was given the opportunity to ask questions. Informed consent given to the use of the above medications. Will continue to adjust psychiatric and non-psychiatric medications (see above "medication" section and orders section for details) as deemed appropriate & based upon diagnoses and response to treatment.     I will continue to order blood tests/labs and diagnostic tests as deemed appropriate and review results as they become available (see orders for details and above  listed lab/test results).    I will order psychiatric records from previous psych hospitals to further elucidate the nature of patient's psychopathology and review once available.    I will gather additional collateral information from friends, family and o/p treatment team to further elucidate the nature of patient's psychopathology and baselline level of psychiatric functioning.         I certify that this patient's inpatient psychiatric hospital services furnished since the previous certification were, and continue to be, required for treatment that could reasonably be expected to improve the patient's condition, or for diagnostic study, and that the patient continues to need, on a daily basis, active treatment furnished directly by or requiring the supervision of inpatient psychiatric facility personnel. In addition, the hospital records show that services furnished were intensive treatment services, admission or related services, or equivalent services.     EXPECTED DISCHARGE DATE/DAY: TBD     DISPOSITION: Home       Signed By:   Cheryll Cockayne, MD  07/28/2016

## 2016-07-29 LAB — GLUCOSE, POC
Glucose (POC): 116 mg/dL — ABNORMAL HIGH (ref 65–100)
Glucose (POC): 93 mg/dL (ref 65–100)

## 2016-07-29 MED ORDER — LISINOPRIL 40 MG TAB
40 mg | ORAL_TABLET | Freq: Every day | ORAL | 0 refills | Status: AC
Start: 2016-07-29 — End: ?

## 2016-07-29 MED ORDER — MONTELUKAST 10 MG TAB
10 mg | ORAL_TABLET | Freq: Every evening | ORAL | 0 refills | Status: AC
Start: 2016-07-29 — End: ?

## 2016-07-29 MED ORDER — HYDROCHLOROTHIAZIDE 25 MG TAB
25 mg | ORAL_TABLET | Freq: Every day | ORAL | 0 refills | Status: DC
Start: 2016-07-29 — End: 2017-07-14

## 2016-07-29 MED ORDER — METFORMIN SR 500 MG 24 HR TABLET
500 mg | ORAL_TABLET | Freq: Every day | ORAL | 0 refills | Status: DC
Start: 2016-07-29 — End: 2017-07-14

## 2016-07-29 MED ORDER — ARIPIPRAZOLE 5 MG TAB
5 mg | ORAL_TABLET | Freq: Every day | ORAL | 0 refills | Status: AC
Start: 2016-07-29 — End: ?

## 2016-07-29 MED ORDER — DILTIAZEM ER 240 MG 24 HR CAP
240 mg | ORAL_CAPSULE | Freq: Every day | ORAL | 0 refills | Status: AC
Start: 2016-07-29 — End: ?

## 2016-07-29 MED ORDER — MUPIROCIN 2 % OINTMENT
2 % | Freq: Two times a day (BID) | CUTANEOUS | 0 refills | Status: DC
Start: 2016-07-29 — End: 2017-07-14

## 2016-07-29 MED ORDER — CLONIDINE 0.1 MG TAB
0.1 mg | ORAL_TABLET | Freq: Two times a day (BID) | ORAL | 0 refills | Status: AC
Start: 2016-07-29 — End: ?

## 2016-07-29 MED ORDER — DULOXETINE 20 MG CAP, DELAYED RELEASE
20 mg | ORAL_CAPSULE | Freq: Every day | ORAL | 0 refills | Status: AC
Start: 2016-07-29 — End: ?

## 2016-07-29 MED ORDER — PREGABALIN 100 MG CAP
100 mg | ORAL_CAPSULE | Freq: Two times a day (BID) | ORAL | 0 refills | Status: DC
Start: 2016-07-29 — End: 2017-07-14

## 2016-07-29 MED FILL — CLONIDINE 0.1 MG TAB: 0.1 mg | ORAL | Qty: 1

## 2016-07-29 MED FILL — LISINOPRIL 20 MG TAB: 20 mg | ORAL | Qty: 2

## 2016-07-29 MED FILL — METFORMIN SR 500 MG 24 HR TABLET: 500 mg | ORAL | Qty: 1

## 2016-07-29 MED FILL — ARIPIPRAZOLE 5 MG TAB: 5 mg | ORAL | Qty: 1

## 2016-07-29 MED FILL — HYDROCHLOROTHIAZIDE 25 MG TAB: 25 mg | ORAL | Qty: 1

## 2016-07-29 MED FILL — DULOXETINE 20 MG CAP, DELAYED RELEASE: 20 mg | ORAL | Qty: 1

## 2016-07-29 MED FILL — LYRICA 100 MG CAPSULE: 100 mg | ORAL | Qty: 1

## 2016-07-29 MED FILL — ACETAMINOPHEN 325 MG TABLET: 325 mg | ORAL | Qty: 2

## 2016-07-29 MED FILL — DILTIAZEM ER 240 MG 24 HR CAP: 240 mg | ORAL | Qty: 1

## 2016-07-29 NOTE — Behavioral Health Treatment Team (Signed)
PRN Medication Documentation    Specific patient behavior that led to need for PRN medication: complaint of pain in lower back and BLE (9/10)  Staff interventions attempted prior to PRN being given: utilized scheduled cream for BLE  PRN medication given: Acetaminophen 650mg   Patient response/effectiveness of PRN medication: re-evaluated at 0920.  Patient stated pain decreased from a 9/10 to a 4/10.  Currently sitting quietly with no adverse effects.

## 2016-07-29 NOTE — Behavioral Health Treatment Team (Addendum)
1515 Received patient talking in DR with peers. Greeted staff with a smile.  NAD.  Asked "do you have my cab  & bus tickets".   Redirected with discharged instructions are complete.  NAD. On q 15 min. Safety checks.     VS are WNL    Voiced no compaints

## 2016-07-29 NOTE — Discharge Summary (Signed)
PSYCHIATRIC DISCHARGE SUMMARY         IDENTIFICATION:    Patient Name  Clarence Rodriguez   Date of Birth 03/04/1976   CSN [id number]   Medical Record Number  [id number]      Age  41 y.o.   PCP None   Admit date:  07/24/2016    Discharge date: 07/29/2016   Room Number  733/01  @ [facility]   Date of Service  07/29/2016               TYPE OF DISCHARGE: REGULAR               CONDITION AT DISCHARGE: good       PROVISIONAL & DISCHARGE DIAGNOSES:    Problem List  Never Reviewed          Codes Class    * (Principal)Bipolar 1 disorder ([place]) ICD-10-CM: F31.9  ICD-9-CM: 296.7               Active Hospital Problems    *Bipolar 1 disorder ([place])        DISCHARGE DIAGNOSIS:   Axis I:  SEE ABOVE  Axis II: SEE ABOVE  Axis III: SEE ABOVE  Axis IV:  lack of structure  Axis V:  70 on admission, 70 on discharge 70(baseline)       CC & HISTORY OF PRESENT ILLNESS:  41 year old caucasian male admitted voluntarily for SI ,homelessness, depression, and homelessness.  Pt was started on antidepressant  And antihypertensives. Mood improved and pt denied SI HI intent and plan.At discharge he did not meet TDO criteria.      SOCIAL HISTORY:    Social History     Social History   ??? Marital status: SINGLE     Spouse name: N/A   ??? Number of children: N/A   ??? Years of education: N/A     Occupational History   ??? Not on file.     Social History Main Topics   ??? Smoking status: Current Every Day Smoker   ??? Smokeless tobacco: Never Used   ??? Alcohol use No   ??? Drug use: No   ??? Sexual activity: Not on file     Other Topics Concern   ??? Not on file     Social History Narrative    41 year old caucasian morbidly obese amle admitted on TDO for SI depression, in the context of being homeless after breaking up with hhis partner.Pt is diabetic and is non compliant with treatment,      FAMILY HISTORY:   History reviewed. No pertinent family history.          HOSPITALIZATION COURSE:    Clarence Rodriguez was admitted to the inpatient psychiatric unit [facility]  hospital for acute psychiatric stabilization in regards to symptomatology as described in the HPI above. The differential diagnosis at time of admission included: depression .  While on the unit Clarence Rodriguez was involved in individual, group, occupational and milieu therapy.  Psychiatric medications were adjusted during this hospitalization including ambien .   Clarence Rodriguez demonstrated a slow, but progressive improvement in overall condition.  Much of patient's depression appeared to be related to situational stressors and psychological factors.  Please see individual progress notes for more specific details regarding patient's hospitalization course.   At time of dc, Clarence Rodriguez was without significant problems with depression psychosis  mania.  Overall presentation at time of discharge is most consistent with the diagnosis  of adjustment disorder with depressed mood. Patient with request for discharge today. There are no grounds to seek a TDO. Patient has maximized benefit to be derived from acute inpatient psychiatric treatment.  All members of the treatment team concur with each other in regards to plans for discharge today per patient's request.            LABS AND IMAGAING:    Labs Reviewed   LIPID PANEL - Abnormal; Notable for the following:        Result Value    Triglyceride 170 (*)     All other components within normal limits   METABOLIC PANEL, COMPREHENSIVE - Abnormal; Notable for the following:     BUN/Creatinine ratio 11 (*)     Albumin 3.3 (*)     Globulin 4.5 (*)     A-G Ratio 0.7 (*)     All other components within normal limits   VALPROIC ACID - Abnormal; Notable for the following:     Valproic acid 37 (*)     All other components within normal limits   GLUCOSE, POC - Abnormal; Notable for the following:     Glucose (POC) 101 (*)     All other components within normal limits   GLUCOSE, POC - Abnormal; Notable for the following:     Glucose (POC) 134 (*)      All other components within normal limits   GLUCOSE, POC - Abnormal; Notable for the following:     Glucose (POC) 109 (*)     All other components within normal limits   GLUCOSE, POC - Abnormal; Notable for the following:     Glucose (POC) 116 (*)     All other components within normal limits   GLUCOSE, FASTING   TSH 3RD GENERATION   GLUCOSE, POC   GLUCOSE, POC   GLUCOSE, POC   GLUCOSE, POC   GLUCOSE, POC   GLUCOSE, POC     Admission on 07/24/2016   Component Date Value Ref Range Status   ??? Glucose (POC) 07/24/2016 92  65 - 100 mg/dL Final   ??? Performed by 07/24/2016 RUDD DEBORAH   Final   ??? Glucose 07/25/2016 97  65 - 100 MG/DL Final   ??? TSH 07/25/2016 2.38  0.36 - 3.74 uIU/mL Final   ??? LIPID PROFILE 07/25/2016        Final   ??? Cholesterol, total 07/25/2016 107  <200 MG/DL Final   ??? Triglyceride 07/25/2016 170* <150 MG/DL Final   ??? HDL Cholesterol 07/25/2016 36  MG/DL Final   ??? LDL, calculated 07/25/2016 37  0 - 100 MG/DL Final   ??? VLDL, calculated 07/25/2016 34  MG/DL Final   ??? CHOL/HDL Ratio 07/25/2016 3.0  0 - 5.0   Final   ??? Sodium 07/25/2016 137  136 - 145 mmol/L Final   ??? Potassium 07/25/2016 3.9  3.5 - 5.1 mmol/L Final   ??? Chloride 07/25/2016 100  97 - 108 mmol/L Final   ??? CO2 07/25/2016 31  21 - 32 mmol/L Final   ??? Anion gap 07/25/2016 6  5 - 15 mmol/L Final   ??? Glucose 07/25/2016 94  65 - 100 mg/dL Final   ??? BUN 07/25/2016 10  6 - 20 MG/DL Final   ??? Creatinine 07/25/2016 0.91  0.70 - 1.30 MG/DL Final   ??? BUN/Creatinine ratio 07/25/2016 11* 12 - 20   Final   ??? GFR est AA 07/25/2016 >60  >60 ml/min/1.74m Final   ???  GFR est non-AA 07/25/2016 >60  >60 ml/min/1.74m Final   ??? Calcium 07/25/2016 8.7  8.5 - 10.1 MG/DL Final   ??? Bilirubin, total 07/25/2016 0.5  0.2 - 1.0 MG/DL Final   ??? ALT (SGPT) 07/25/2016 37  12 - 78 U/L Final   ??? AST (SGOT) 07/25/2016 24  15 - 37 U/L Final   ??? Alk. phosphatase 07/25/2016 95  45 - 117 U/L Final   ??? Protein, total 07/25/2016 7.8  6.4 - 8.2 g/dL Final    ??? Albumin 07/25/2016 3.3* 3.5 - 5.0 g/dL Final   ??? Globulin 07/25/2016 4.5* 2.0 - 4.0 g/dL Final   ??? A-G Ratio 07/25/2016 0.7* 1.1 - 2.2   Final   ??? Valproic acid 07/25/2016 37* 50 - 100 ug/ml Final   ??? Glucose (POC) 07/25/2016 99  65 - 100 mg/dL Final   ??? Performed by 07/25/2016 ABelinda Block  Final   ??? Glucose (POC) 07/25/2016 93  65 - 100 mg/dL Final   ??? Performed by 07/25/2016 SCharlette Caffey  Final   ??? Glucose (POC) 07/26/2016 100  65 - 100 mg/dL Final   ??? Performed by 07/26/2016 Clarence Rodriguez   Final   ??? Glucose (POC) 07/26/2016 93  65 - 100 mg/dL Final   ??? Performed by 07/26/2016 ALDRICH LESLIE (PCT)   Final   ??? Glucose (POC) 07/27/2016 101* 65 - 100 mg/dL Final   ??? Performed by 07/27/2016 SSilvestre Gunner  Final   ??? Glucose (POC) 07/27/2016 134* 65 - 100 mg/dL Final   ??? Performed by 07/27/2016 FROWERT MELAINE   Final   ??? Glucose (POC) 07/27/2016 109* 65 - 100 mg/dL Final   ??? Performed by 07/27/2016 FROWERT MELAINE   Final   ??? Glucose (POC) 07/28/2016 93  65 - 100 mg/dL Final   ??? Performed by 07/28/2016 JTawni Levy  Final   ??? Glucose (POC) 07/29/2016 116* 65 - 100 mg/dL Final   ??? Performed by 07/29/2016 VAntonietta Breach  Final   Admission on 07/21/2016, Discharged on 07/24/2016   Component Date Value Ref Range Status   ??? ALCOHOL(ETHYL),SERUM 07/21/2016 <3  0 - 3 MG/DL Final   ??? BENZODIAZEPINES 07/21/2016 NEGATIVE   NEG   Final   ??? BARBITURATES 07/21/2016 NEGATIVE   NEG   Final   ??? THC (TH-CANNABINOL) 07/21/2016 NEGATIVE   NEG   Final   ??? OPIATES 07/21/2016 NEGATIVE   NEG   Final   ??? PCP(PHENCYCLIDINE) 07/21/2016 NEGATIVE   NEG   Final   ??? COCAINE 07/21/2016 NEGATIVE   NEG   Final   ??? AMPHETAMINES 07/21/2016 NEGATIVE   NEG   Final   ??? METHADONE 07/21/2016 NEGATIVE   NEG   Final   ??? HDSCOM 07/21/2016 (NOTE)   Final   ??? WBC 07/21/2016 7.6  4.6 - 13.2 K/uL Final   ??? RBC 07/21/2016 4.41* 4.70 - 5.50 M/uL Final   ??? HGB 07/21/2016 12.9* 13.0 - 16.0 g/dL Final   ??? HCT 07/21/2016 40.8  36.0 - 48.0 % Final    ??? MCV 07/21/2016 92.5  74.0 - 97.0 FL Final   ??? MCH 07/21/2016 29.3  24.0 - 34.0 PG Final   ??? MCHC 07/21/2016 31.6  31.0 - 37.0 g/dL Final   ??? RDW 07/21/2016 14.7* 11.6 - 14.5 % Final   ??? PLATELET 07/21/2016 218  135 - 420 K/uL Final   ??? MPV 07/21/2016 9.8  9.2 - 11.8 FL Final   ???  NEUTROPHILS 07/21/2016 55  40 - 73 % Final   ??? LYMPHOCYTES 07/21/2016 31  21 - 52 % Final   ??? MONOCYTES 07/21/2016 12* 3 - 10 % Final   ??? EOSINOPHILS 07/21/2016 2  0 - 5 % Final   ??? BASOPHILS 07/21/2016 0  0 - 2 % Final   ??? ABS. NEUTROPHILS 07/21/2016 4.2  1.8 - 8.0 K/UL Final   ??? ABS. LYMPHOCYTES 07/21/2016 2.4  0.9 - 3.6 K/UL Final   ??? ABS. MONOCYTES 07/21/2016 0.9  0.05 - 1.2 K/UL Final   ??? ABS. EOSINOPHILS 07/21/2016 0.1  0.0 - 0.4 K/UL Final   ??? ABS. BASOPHILS 07/21/2016 0.0  0.0 - 0.06 K/UL Final   ??? DF 07/21/2016 AUTOMATED    Final   ??? Sodium 07/21/2016 144  136 - 145 mmol/L Final   ??? Potassium 07/21/2016 3.6  3.5 - 5.5 mmol/L Final   ??? Chloride 07/21/2016 105  100 - 108 mmol/L Final   ??? CO2 07/21/2016 29  21 - 32 mmol/L Final   ??? Anion gap 07/21/2016 10  3.0 - 18 mmol/L Final   ??? Glucose 07/21/2016 83  74 - 99 mg/dL Final   ??? BUN 07/21/2016 10  7.0 - 18 MG/DL Final   ??? Creatinine 07/21/2016 0.77  0.6 - 1.3 MG/DL Final   ??? BUN/Creatinine ratio 07/21/2016 13  12 - 20   Final   ??? GFR est AA 07/21/2016 >60  >60 ml/min/1.46m Final   ??? GFR est non-AA 07/21/2016 >60  >60 ml/min/1.776mFinal   ??? Calcium 07/21/2016 8.8  8.5 - 10.1 MG/DL Final   ??? Glucose (POC) 07/21/2016 92  70 - 110 mg/dL Final   ??? Glucose (POC) 07/22/2016 95  70 - 110 mg/dL Final   ??? Ventricular Rate 07/22/2016 89  BPM Final   ??? Atrial Rate 07/22/2016 89  BPM Final   ??? P-R Interval 07/22/2016 144  ms Final   ??? QRS Duration 07/22/2016 90  ms Final   ??? Q-T Interval 07/22/2016 380  ms Final   ??? QTC Calculation (Bezet) 07/22/2016 462  ms Final   ??? Calculated P Axis 07/22/2016 31  degrees Final   ??? Calculated R Axis 07/22/2016 26  degrees Final    ??? Calculated T Axis 07/22/2016 33  degrees Final   ??? Diagnosis 07/22/2016    Final                    Value:Normal sinus rhythm  Normal ECG  No previous ECGs available  Confirmed by PaAileen [email or web address] 07/24/2016 9:05:34 AM       No results found.                DISPOSITION:    Home. Patient to f/u with o/p drug/etoh rehabilitation, psychiatric, and psychotherapy appointments. Patient is to f/u with internist as directed.                 FOLLOW-UP CARE:    Activity as tolerated  Regular Diet  Wound Care: none needed.  Follow-up Information     Follow up With Details Comments [place] [place] 07/30/2016 Walk-in Monday through Friday between 8:30am and 3:00pm to enroll in mental health, housing, case management, and counseling services. [id number] [phone]  None   None ([phone]) Patient stated that they have no PCP  PROGNOSIS:  Greatly dependent upon patient's ability to remain sober and to f/u with o/p drug/etoh rehabilitation and psychiatric/psychotherapy appointments as well as to comply with psychiatric medications as prescribed. Patient denies suicidal or homicidal ideations. Clarence Rodriguez fully contracts for safety. Patient reports many positive predictive factors in terms of not attempting suicide or homicide. Patient appears to be at low risk of suicide or homicide. Patient and family are aware and in agreement with discharge and discharge plan.            DISCHARGE MEDICATIONS: (no changes made).    Informed consent given for the use of following psychotropic medications:  Current Discharge Medication List      START taking these medications    Details   mupirocin (BACTROBAN) 2 % ointment Apply  to affected area two (2) times a day. Indications: Folliculitis  Qty: 22 g, Refills: 0      metFORMIN ER (GLUCOPHAGE XR) 500 mg tablet Take 1 Tab by mouth Daily (before breakfast). Indications: type 2 diabetes mellitus   Qty: 30 Tab, Refills: 0      dilTIAZem CD (CARDIZEM CD) 240 mg ER capsule Take 1 Cap by mouth daily. Indications: hypertension  Qty: 30 Cap, Refills: 0      DULoxetine (CYMBALTA) 20 mg capsule Take 1 Cap by mouth daily. Indications: ANXIETY WITH DEPRESSION  Qty: 30 Cap, Refills: 0      hydroCHLOROthiazide (HYDRODIURIL) 25 mg tablet Take 1 Tab by mouth daily. Indications: diabetes insipidus  Qty: 30 Tab, Refills: 0         CONTINUE these medications which have CHANGED    Details   lisinopril (PRINIVIL, ZESTRIL) 40 mg tablet Take 1 Tab by mouth daily. Indications: hypertension  Qty: 30 Tab, Refills: 0      montelukast (SINGULAIR) 10 mg tablet Take 1 Tab by mouth nightly. Indications: Allergic Rhinitis  Qty: 30 Tab, Refills: 0      pregabalin (LYRICA) 100 mg capsule Take 1 Cap by mouth two (2) times a day. Max Daily Amount: 200 mg. Indications: Generalized Anxiety Disorder  Qty: 60 Cap, Refills: 0    Associated Diagnoses: Bipolar 1 disorder (HCC)      cloNIDine HCl (CATAPRES) 0.1 mg tablet Take 1 Tab by mouth two (2) times a day. Indications: hypertension  Qty: 60 Tab, Refills: 0      ARIPiprazole (ABILIFY) 5 mg tablet Take 1 Tab by mouth daily. Indications: Schizophrenia  Qty: 30 Tab, Refills: 0         CONTINUE these medications which have NOT CHANGED    Details   divalproex DR (DEPAKOTE) 250 mg tablet Take 750 mg by mouth two (2) times a day.         STOP taking these medications       dilTIAZem ER (CARDIZEM LA) 240 mg Tb24 tablet Comments:   Reason for Stopping:         ibuprofen 200 mg cap Comments:   Reason for Stopping:         metFORMIN (GLUCOPHAGE) 500 mg tablet Comments:   Reason for Stopping:                      A coordinated, multidisplinary treatment team round was conducted with Clarence Rodriguez is done daily here at [facility] . This team consists of the nurse, psychiatric unit pharmcist, Education officer, museum and Probation officer.     I have spent greater than 35 minutes on discharge work.  Signed:   Isac Caddy, MD  07/29/2016

## 2016-07-29 NOTE — Other (Addendum)
Behavioral Health Interdisciplinary Rounds     Patient Name: Clarence Rodriguez  Age: 41 y.o.  Room/Bed:  733/01  Primary Diagnosis: Bipolar 1 disorder (HCC)   Admission Status: Involuntary Commitment     Readmission within 30 days: no  Power of Attorney in place: no  Patient requires a blocked bed: no          Reason for blocked bed: n/a    VTE Prophylaxis: No  Flu vaccine given : yes -07/25/16  Mobility needs/Fall risk: no    Nutritional Plan: no  Consults:          Labs/Testing due today?: no    Sleep hours:        Participation in Care/Groups:  yes  Medication Compliant?: Yes  PRNS (last 24 hours): None    Restraints (last 24 hours):  no  Substance Abuse:  no  CIWA (range last 24 hours):  COWS (range last 24 hours):   Alcohol screening (AUDIT) completed -  AUDIT Score: 0  If applicable, date SBIRT discussed in treatment team AND documented:   Tobacco - patient is a smoker: yes   Date tobacco education completed by RN: 07/24/16  24 hour chart check complete: yes     Patient goal(s) for today: Discharge  Treatment team focus/goals: Discharge  Progress note     LOS:  5  Expected LOS: 5    Financial concerns/prescription coverage: Medicare  Date of last family contact:       Family requesting physician contact today:    [place] [facility] [place] [facility]'s [facility]  Guns in the home: No      [facility]    Participating treatment team members: Clarence PilgrimJoshua Roehrs, Valaria GoodKatherine Wallace, MSW; Dr. Mabeline CarasHaine, MD; Sheldon SilvanJessica Lurey, RN

## 2016-07-29 NOTE — Progress Notes (Signed)
Physical Therapy  Noted pt planned D/C for today, nsg confirmed.  Pt not in room and appears to be participating in group at this time.  MD D/C order noted to be for home.  Previous PT note/eval 3/8 recommending SNF.  Will f/u in event of delayed D/C or for additional D/C needs.  Safety concerns regarding ambulation without DME, knee pain, and lack of residence cited in previous assessment.  Thank you,  Lurline Delasey D Jansen, PT, DPT

## 2016-07-29 NOTE — Behavioral Health Treatment Team (Addendum)
0315 - Pt awake. Sitting on side of bed in room with  CPAP mask in hand. States, "I put it on standby when I got up to use bathroom like respiratory told me to do;Marland Kitchen. Can you call respiratory? This thing is drying me out."     Pt also stated, "I did sleep better; I feel better, I "m  just not sleepy now." Pt stated he was up and would not be putting mask back on, but still wanted to talk to respiratory reference the issue of "feeling so dry".    Respiratory was called and will be coming to check on the machine. Pt turned CPAP off. In no acute respiratory distress.    0425 - Respiratory on unit to see pt. Settings given to respiratory by pt  were incorrect. Pt will need to have a new sleep study completed  to acquire new settings.

## 2016-07-29 NOTE — Progress Notes (Signed)
Problem: Depressed Mood (Adult/Pediatric)  Goal: *STG: Participates in treatment plan  Outcome: Progressing Towards Goal  Review meds, out on unit engaged. Demonstrates manipulative behaviors such as splitting staff. hyperfocused on staff and peers. Denies SI, no self harming behaviors. Daily goal is to complete d/c plans. Staff focus is on d/c planning

## 2016-07-29 NOTE — Discharge Summary (Signed)
PSYCHIATRIC DISCHARGE SUMMARY         IDENTIFICATION:    Patient Name  Clarence Rodriguez   Date of Birth 03/04/1976   CSN [id number]   Medical Record Number  [id number]      Age  41 y.o.   PCP None   Admit date:  07/24/2016    Discharge date: 07/29/2016   Room Number  733/01  @ [facility]   Date of Service  07/29/2016               TYPE OF DISCHARGE: REGULAR               CONDITION AT DISCHARGE: good       PROVISIONAL & DISCHARGE DIAGNOSES:    Problem List  Never Reviewed          Codes Class    * (Principal)Bipolar 1 disorder ([place]) ICD-10-CM: F31.9  ICD-9-CM: 296.7               Active Hospital Problems    *Bipolar 1 disorder ([place])        DISCHARGE DIAGNOSIS:   Axis I:  SEE ABOVE  Axis II: SEE ABOVE  Axis III: SEE ABOVE  Axis IV:  lack of structure  Axis V:  70 on admission, 70 on discharge 70(baseline)       CC & HISTORY OF PRESENT ILLNESS:  41 year old caucasian male admitted voluntarily for SI ,homelessness, depression, and homelessness.  Pt was started on antidepressant  And antihypertensives. Mood improved and pt denied SI HI intent and plan.At discharge he did not meet TDO criteria.      SOCIAL HISTORY:    Social History     Social History   ??? Marital status: SINGLE     Spouse name: N/A   ??? Number of children: N/A   ??? Years of education: N/A     Occupational History   ??? Not on file.     Social History Main Topics   ??? Smoking status: Current Every Day Smoker   ??? Smokeless tobacco: Never Used   ??? Alcohol use No   ??? Drug use: No   ??? Sexual activity: Not on file     Other Topics Concern   ??? Not on file     Social History Narrative    41 year old caucasian morbidly obese amle admitted on TDO for SI depression, in the context of being homeless after breaking up with hhis partner.Pt is diabetic and is non compliant with treatment,      FAMILY HISTORY:   History reviewed. No pertinent family history.          HOSPITALIZATION COURSE:    Clarence Rodriguez was admitted to the inpatient psychiatric unit [facility]  hospital for acute psychiatric stabilization in regards to symptomatology as described in the HPI above. The differential diagnosis at time of admission included: depression .  While on the unit Clarence Rodriguez was involved in individual, group, occupational and milieu therapy.  Psychiatric medications were adjusted during this hospitalization including ambien .   Clarence Rodriguez demonstrated a slow, but progressive improvement in overall condition.  Much of patient's depression appeared to be related to situational stressors and psychological factors.  Please see individual progress notes for more specific details regarding patient's hospitalization course.   At time of dc, Clarence Rodriguez was without significant problems with depression psychosis  mania.  Overall presentation at time of discharge is most consistent with the diagnosis  of adjustment disorder with depressed mood. Patient with request for discharge today. There are no grounds to seek a TDO. Patient has maximized benefit to be derived from acute inpatient psychiatric treatment.  All members of the treatment team concur with each other in regards to plans for discharge today per patient's request.            LABS AND IMAGAING:    Labs Reviewed   LIPID PANEL - Abnormal; Notable for the following:        Result Value    Triglyceride 170 (*)     All other components within normal limits   METABOLIC PANEL, COMPREHENSIVE - Abnormal; Notable for the following:     BUN/Creatinine ratio 11 (*)     Albumin 3.3 (*)     Globulin 4.5 (*)     A-G Ratio 0.7 (*)     All other components within normal limits   VALPROIC ACID - Abnormal; Notable for the following:     Valproic acid 37 (*)     All other components within normal limits   GLUCOSE, POC - Abnormal; Notable for the following:     Glucose (POC) 101 (*)     All other components within normal limits   GLUCOSE, POC - Abnormal; Notable for the following:     Glucose (POC) 134 (*)     All other components within  normal limits   GLUCOSE, POC - Abnormal; Notable for the following:     Glucose (POC) 109 (*)     All other components within normal limits   GLUCOSE, POC - Abnormal; Notable for the following:     Glucose (POC) 116 (*)     All other components within normal limits   GLUCOSE, FASTING   TSH 3RD GENERATION   GLUCOSE, POC   GLUCOSE, POC   GLUCOSE, POC   GLUCOSE, POC   GLUCOSE, POC   GLUCOSE, POC     Admission on 07/24/2016   Component Date Value Ref Range Status   ??? Glucose (POC) 07/24/2016 92  65 - 100 mg/dL Final   ??? Performed by 07/24/2016 RUDD DEBORAH   Final   ??? Glucose 07/25/2016 97  65 - 100 MG/DL Final   ??? TSH 07/25/2016 2.38  0.36 - 3.74 uIU/mL Final   ??? LIPID PROFILE 07/25/2016        Final   ??? Cholesterol, total 07/25/2016 107  <200 MG/DL Final   ??? Triglyceride 07/25/2016 170* <150 MG/DL Final   ??? HDL Cholesterol 07/25/2016 36  MG/DL Final   ??? LDL, calculated 07/25/2016 37  0 - 100 MG/DL Final   ??? VLDL, calculated 07/25/2016 34  MG/DL Final   ??? CHOL/HDL Ratio 07/25/2016 3.0  0 - 5.0   Final   ??? Sodium 07/25/2016 137  136 - 145 mmol/L Final   ??? Potassium 07/25/2016 3.9  3.5 - 5.1 mmol/L Final   ??? Chloride 07/25/2016 100  97 - 108 mmol/L Final   ??? CO2 07/25/2016 31  21 - 32 mmol/L Final   ??? Anion gap 07/25/2016 6  5 - 15 mmol/L Final   ??? Glucose 07/25/2016 94  65 - 100 mg/dL Final   ??? BUN 07/25/2016 10  6 - 20 MG/DL Final   ??? Creatinine 07/25/2016 0.91  0.70 - 1.30 MG/DL Final   ??? BUN/Creatinine ratio 07/25/2016 11* 12 - 20   Final   ??? GFR est AA 07/25/2016 >60  >60 ml/min/1.69m Final   ???  GFR est non-AA 07/25/2016 >60  >60 ml/min/1.74m Final   ??? Calcium 07/25/2016 8.7  8.5 - 10.1 MG/DL Final   ??? Bilirubin, total 07/25/2016 0.5  0.2 - 1.0 MG/DL Final   ??? ALT (SGPT) 07/25/2016 37  12 - 78 U/L Final   ??? AST (SGOT) 07/25/2016 24  15 - 37 U/L Final   ??? Alk. phosphatase 07/25/2016 95  45 - 117 U/L Final   ??? Protein, total 07/25/2016 7.8  6.4 - 8.2 g/dL Final   ??? Albumin 07/25/2016 3.3* 3.5 - 5.0 g/dL Final   ???  Globulin 07/25/2016 4.5* 2.0 - 4.0 g/dL Final   ??? A-G Ratio 07/25/2016 0.7* 1.1 - 2.2   Final   ??? Valproic acid 07/25/2016 37* 50 - 100 ug/ml Final   ??? Glucose (POC) 07/25/2016 99  65 - 100 mg/dL Final   ??? Performed by 07/25/2016 ABelinda Block  Final   ??? Glucose (POC) 07/25/2016 93  65 - 100 mg/dL Final   ??? Performed by 07/25/2016 SCharlette Caffey  Final   ??? Glucose (POC) 07/26/2016 100  65 - 100 mg/dL Final   ??? Performed by 07/26/2016 Hill  Charnetta   Final   ??? Glucose (POC) 07/26/2016 93  65 - 100 mg/dL Final   ??? Performed by 07/26/2016 ALDRICH LESLIE (PCT)   Final   ??? Glucose (POC) 07/27/2016 101* 65 - 100 mg/dL Final   ??? Performed by 07/27/2016 SSilvestre Gunner  Final   ??? Glucose (POC) 07/27/2016 134* 65 - 100 mg/dL Final   ??? Performed by 07/27/2016 FROWERT MELAINE   Final   ??? Glucose (POC) 07/27/2016 109* 65 - 100 mg/dL Final   ??? Performed by 07/27/2016 FROWERT MELAINE   Final   ??? Glucose (POC) 07/28/2016 93  65 - 100 mg/dL Final   ??? Performed by 07/28/2016 JTawni Levy  Final   ??? Glucose (POC) 07/29/2016 116* 65 - 100 mg/dL Final   ??? Performed by 07/29/2016 VAntonietta Breach  Final   Admission on 07/21/2016, Discharged on 07/24/2016   Component Date Value Ref Range Status   ??? ALCOHOL(ETHYL),SERUM 07/21/2016 <3  0 - 3 MG/DL Final   ??? BENZODIAZEPINES 07/21/2016 NEGATIVE   NEG   Final   ??? BARBITURATES 07/21/2016 NEGATIVE   NEG   Final   ??? THC (TH-CANNABINOL) 07/21/2016 NEGATIVE   NEG   Final   ??? OPIATES 07/21/2016 NEGATIVE   NEG   Final   ??? PCP(PHENCYCLIDINE) 07/21/2016 NEGATIVE   NEG   Final   ??? COCAINE 07/21/2016 NEGATIVE   NEG   Final   ??? AMPHETAMINES 07/21/2016 NEGATIVE   NEG   Final   ??? METHADONE 07/21/2016 NEGATIVE   NEG   Final   ??? HDSCOM 07/21/2016 (NOTE)   Final   ??? WBC 07/21/2016 7.6  4.6 - 13.2 K/uL Final   ??? RBC 07/21/2016 4.41* 4.70 - 5.50 M/uL Final   ??? HGB 07/21/2016 12.9* 13.0 - 16.0 g/dL Final   ??? HCT 07/21/2016 40.8  36.0 - 48.0 % Final   ??? MCV 07/21/2016 92.5  74.0 - 97.0 FL Final   ??? MCH  07/21/2016 29.3  24.0 - 34.0 PG Final   ??? MCHC 07/21/2016 31.6  31.0 - 37.0 g/dL Final   ??? RDW 07/21/2016 14.7* 11.6 - 14.5 % Final   ??? PLATELET 07/21/2016 218  135 - 420 K/uL Final   ??? MPV 07/21/2016 9.8  9.2 - 11.8 FL Final   ???  NEUTROPHILS 07/21/2016 55  40 - 73 % Final   ??? LYMPHOCYTES 07/21/2016 31  21 - 52 % Final   ??? MONOCYTES 07/21/2016 12* 3 - 10 % Final   ??? EOSINOPHILS 07/21/2016 2  0 - 5 % Final   ??? BASOPHILS 07/21/2016 0  0 - 2 % Final   ??? ABS. NEUTROPHILS 07/21/2016 4.2  1.8 - 8.0 K/UL Final   ??? ABS. LYMPHOCYTES 07/21/2016 2.4  0.9 - 3.6 K/UL Final   ??? ABS. MONOCYTES 07/21/2016 0.9  0.05 - 1.2 K/UL Final   ??? ABS. EOSINOPHILS 07/21/2016 0.1  0.0 - 0.4 K/UL Final   ??? ABS. BASOPHILS 07/21/2016 0.0  0.0 - 0.06 K/UL Final   ??? DF 07/21/2016 AUTOMATED    Final   ??? Sodium 07/21/2016 144  136 - 145 mmol/L Final   ??? Potassium 07/21/2016 3.6  3.5 - 5.5 mmol/L Final   ??? Chloride 07/21/2016 105  100 - 108 mmol/L Final   ??? CO2 07/21/2016 29  21 - 32 mmol/L Final   ??? Anion gap 07/21/2016 10  3.0 - 18 mmol/L Final   ??? Glucose 07/21/2016 83  74 - 99 mg/dL Final   ??? BUN 07/21/2016 10  7.0 - 18 MG/DL Final   ??? Creatinine 07/21/2016 0.77  0.6 - 1.3 MG/DL Final   ??? BUN/Creatinine ratio 07/21/2016 13  12 - 20   Final   ??? GFR est AA 07/21/2016 >60  >60 ml/min/1.86m Final   ??? GFR est non-AA 07/21/2016 >60  >60 ml/min/1.779mFinal   ??? Calcium 07/21/2016 8.8  8.5 - 10.1 MG/DL Final   ??? Glucose (POC) 07/21/2016 92  70 - 110 mg/dL Final   ??? Glucose (POC) 07/22/2016 95  70 - 110 mg/dL Final   ??? Ventricular Rate 07/22/2016 89  BPM Final   ??? Atrial Rate 07/22/2016 89  BPM Final   ??? P-R Interval 07/22/2016 144  ms Final   ??? QRS Duration 07/22/2016 90  ms Final   ??? Q-T Interval 07/22/2016 380  ms Final   ??? QTC Calculation (Bezet) 07/22/2016 462  ms Final   ??? Calculated P Axis 07/22/2016 31  degrees Final   ??? Calculated R Axis 07/22/2016 26  degrees Final   ??? Calculated T Axis 07/22/2016 33  degrees Final   ??? Diagnosis 07/22/2016     Final                    Value:Normal sinus rhythm  Normal ECG  No previous ECGs available  Confirmed by PaAileen [id number] 07/24/2016 9:05:34 AM       No results found.                DISPOSITION:    Home. Patient to f/u with o/p drug/etoh rehabilitation, psychiatric, and psychotherapy appointments. Patient is to f/u with internist as directed.                 FOLLOW-UP CARE:    Activity as tolerated  Regular Diet  Wound Care: none needed.  Follow-up Information     Follow up With Details Comments [place] 07/30/2016 Walk-in Monday through Friday between 8:30am and 3:00pm to enroll in mental health, housing, case management, and counseling services. [place] [phone]  None   None ([phone]) Patient stated that they have no PCP  PROGNOSIS:  Greatly dependent upon patient's ability to remain sober and to f/u with o/p drug/etoh rehabilitation and psychiatric/psychotherapy appointments as well as to comply with psychiatric medications as prescribed. Patient denies suicidal or homicidal ideations. Man [place] fully contracts for safety. Patient reports many positive predictive factors in terms of not attempting suicide or homicide. Patient appears to be at low risk of suicide or homicide. Patient and family are aware and in agreement with discharge and discharge plan.            DISCHARGE MEDICATIONS: (no changes made).    Informed consent given for the use of following psychotropic medications:  Current Discharge Medication List      START taking these medications    Details   mupirocin (BACTROBAN) 2 % ointment Apply  to affected area two (2) times a day. Indications: Folliculitis  Qty: 22 g, Refills: 0      metFORMIN ER (GLUCOPHAGE XR) 500 mg tablet Take 1 Tab by mouth Daily (before breakfast). Indications: type 2 diabetes mellitus  Qty: 30 Tab, Refills: 0      dilTIAZem CD (CARDIZEM CD) 240 mg ER capsule Take 1  Cap by mouth daily. Indications: hypertension  Qty: 30 Cap, Refills: 0      DULoxetine (CYMBALTA) 20 mg capsule Take 1 Cap by mouth daily. Indications: ANXIETY WITH DEPRESSION  Qty: 30 Cap, Refills: 0      hydroCHLOROthiazide (HYDRODIURIL) 25 mg tablet Take 1 Tab by mouth daily. Indications: diabetes insipidus  Qty: 30 Tab, Refills: 0         CONTINUE these medications which have CHANGED    Details   lisinopril (PRINIVIL, ZESTRIL) 40 mg tablet Take 1 Tab by mouth daily. Indications: hypertension  Qty: 30 Tab, Refills: 0      montelukast (SINGULAIR) 10 mg tablet Take 1 Tab by mouth nightly. Indications: Allergic Rhinitis  Qty: 30 Tab, Refills: 0      pregabalin (LYRICA) 100 mg capsule Take 1 Cap by mouth two (2) times a day. Max Daily Amount: 200 mg. Indications: Generalized Anxiety Disorder  Qty: 60 Cap, Refills: 0    Associated Diagnoses: Bipolar 1 disorder (HCC)      cloNIDine HCl (CATAPRES) 0.1 mg tablet Take 1 Tab by mouth two (2) times a day. Indications: hypertension  Qty: 60 Tab, Refills: 0      ARIPiprazole (ABILIFY) 5 mg tablet Take 1 Tab by mouth daily. Indications: Schizophrenia  Qty: 30 Tab, Refills: 0         CONTINUE these medications which have NOT CHANGED    Details   divalproex DR (DEPAKOTE) 250 mg tablet Take 750 mg by mouth two (2) times a day.         STOP taking these medications       dilTIAZem ER (CARDIZEM LA) 240 mg Tb24 tablet Comments:   Reason for Stopping:         ibuprofen 200 mg cap Comments:   Reason for Stopping:         metFORMIN (GLUCOPHAGE) 500 mg tablet Comments:   Reason for Stopping:                      A coordinated, multidisplinary treatment team round was conducted with Jaman Aro is done daily here at [facility] . This team consists of the nurse, psychiatric unit pharmcist, Education officer, museum and Probation officer.     I have spent greater than 35 minutes on discharge work.  Signed:  Isac Caddy, MD  07/29/2016

## 2016-07-29 NOTE — Behavioral Health Treatment Team (Signed)
GROUP THERAPY PROGRESS NOTE    Gerilyn PilgrimJoshua Heater participated in a Morning Process Group on the General Unit with a focus on identifying feelings, planning for the day, and learning more about Self-nurturance.    Group time: 75 minutes.     Personal goal for participation: To identify feelings, increase the capacity to manage one???s ability to cope through Wellness in several areas of personal life.      Goal orientation: The patient will be able to introduce themselves to their peers, identify their feelings, and consider the importance of coping skill development, particularly in seven areas of one???s life.     Group therapy participation: With prompting, this patient participated in the group.     Therapeutic interventions reviewed and discussed: The patients were encouraged to identify themselves by their first names, speak to their feelings, and define a goal for their day. The group participated in a discussion of a handout that suggested seven areas of one's life that may contribute to self-nurturance: physical nurturing, adult relationships, my voice, creativity, self-compassion, contributions and/or meaning, and limit setting. The group members were provided a copy of the handout to review on their own.    Impression of participation: The patient said he slept better last night because he used a CPAP machine. He indicated that he would probably be discharged in the next day or two and that he was [place]. 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 He indicated that he would probably pursue these studies at [employer]. He expressed no current SI/HI and no overt psychotic symptoms. He thanked his peers for knowing them and obtaining particular closeness with the dialogues held with a couple of them. His affect was less depressed and anxious than on admission. His mood was positive and forward looking.

## 2016-07-29 NOTE — Behavioral Health Treatment Team (Signed)
Behavioral Health Transition Record to Provider    Patient Name: Clarence Rodriguez  Date of Birth: 1976/01/08  Medical Record Number: [id number]  Date of Admission: 07/24/2016  Date of Discharge: 07/29/2016    Attending Provider: Isac Caddy, MD  Discharging Provider: Isac Caddy, MD  To contact this individual call [phone] and ask the operator to page.  If unavailable, ask to be transferred to [facility] Provider on call.  [place] Provider will be available on call 24/7 and during holidays.    Primary Care Provider: None    No Known Allergies    Reason for Admission: Patient was admitted under a temporary detention order (TDO) for severe depression with suicidal ideations proving to be an imminent danger to self and others and an inability to care for self.    Admission Diagnosis: bipolar  Bipolar 1 disorder ([place])    * No surgery found *    Results for orders placed or performed during the hospital encounter of 07/24/16   GLUCOSE, FASTING   Result Value Ref Range    Glucose 97 65 - 100 MG/DL   TSH 3RD GENERATION   Result Value Ref Range    TSH 2.38 0.36 - 3.74 uIU/mL   LIPID PANEL   Result Value Ref Range    LIPID PROFILE          Cholesterol, total 107 <200 MG/DL    Triglyceride 170 (H) <150 MG/DL    HDL Cholesterol 36 MG/DL    LDL, calculated 37 0 - 100 MG/DL    VLDL, calculated 34 MG/DL    CHOL/HDL Ratio 3.0 0 - 5.0     METABOLIC PANEL, COMPREHENSIVE   Result Value Ref Range    Sodium 137 136 - 145 mmol/L    Potassium 3.9 3.5 - 5.1 mmol/L    Chloride 100 97 - 108 mmol/L    CO2 31 21 - 32 mmol/L    Anion gap 6 5 - 15 mmol/L    Glucose 94 65 - 100 mg/dL    BUN 10 6 - 20 MG/DL    Creatinine 0.91 0.70 - 1.30 MG/DL    BUN/Creatinine ratio 11 (L) 12 - 20      GFR est AA >60 >60 ml/min/1.1m    GFR est non-AA >60 >60 ml/min/1.767m   Calcium 8.7 8.5 - 10.1 MG/DL    Bilirubin, total 0.5 0.2 - 1.0 MG/DL    ALT (SGPT) 37 12 - 78 U/L    AST (SGOT) 24 15 - 37 U/L    Alk. phosphatase 95 45 - 117 U/L     Protein, total 7.8 6.4 - 8.2 g/dL    Albumin 3.3 (L) 3.5 - 5.0 g/dL    Globulin 4.5 (H) 2.0 - 4.0 g/dL    A-G Ratio 0.7 (L) 1.1 - 2.2     VALPROIC ACID   Result Value Ref Range    Valproic acid 37 (L) 50 - 100 ug/ml   GLUCOSE, POC   Result Value Ref Range    Glucose (POC) 92 65 - 100 mg/dL    Performed by RUJulianne Rice  GLUCOSE, POC   Result Value Ref Range    Glucose (POC) 99 65 - 100 mg/dL    Performed by AnBelinda Block  GLUCOSE, POC   Result Value Ref Range    Glucose (POC) 93 65 - 100 mg/dL    Performed by SaClallamPOC  Result Value Ref Range    Glucose (POC) 100 65 - 100 mg/dL    Performed by Toy Cookey    GLUCOSE, POC   Result Value Ref Range    Glucose (POC) 93 65 - 100 mg/dL    Performed by ALDRICH LESLIE (PCT)    GLUCOSE, POC   Result Value Ref Range    Glucose (POC) 101 (H) 65 - 100 mg/dL    Performed by Silvestre Gunner    GLUCOSE, POC   Result Value Ref Range    Glucose (POC) 134 (H) 65 - 100 mg/dL    Performed by [place], POC   Result Value Ref Range    Glucose (POC) 109 (H) 65 - 100 mg/dL    Performed by [place], POC   Result Value Ref Range    Glucose (POC) 93 65 - 100 mg/dL    Performed by Tawni Levy    GLUCOSE, POC   Result Value Ref Range    Glucose (POC) 116 (H) 65 - 100 mg/dL    Performed by Viol Erin    GLUCOSE, POC   Result Value Ref Range    Glucose (POC) 93 65 - 100 mg/dL    Performed by Tawni Levy        Immunizations administered during this encounter:   Immunization History   Administered Date(s) Administered   ??? Influenza Vaccine (Quad) PF 07/25/2016       Screening for Metabolic Disorders for Patients on Antipsychotic Medications  (Data obtained from the EMR)    Estimated Body Mass Index  Estimated body mass index is 61.55 kg/(m^2) as calculated from the following:    Height as of this encounter: 5' 11" (1.803 m).    Weight as of this encounter: 200.2 kg (441 lb 4.8 oz).     Vital Signs/Blood Pressure   Visit Vitals   ??? BP 156/81   ??? Pulse 80   ??? Temp 98 ??F (36.7 ??C)   ??? Resp 20   ??? Ht 5' 11" (1.803 m)   ??? Wt (!) 200.2 kg (441 lb 4.8 oz)   ??? SpO2 96%   ??? BMI 61.55 kg/m2       Blood Glucose/Hemoglobin A1c  Lab Results   Component Value Date/Time    Glucose 97 07/25/2016 06:36 AM    Glucose 94 07/25/2016 06:36 AM    Glucose (POC) 93 07/29/2016 04:06 PM       No results found for: HBA1C, HGBE8, HBA1CEXT     Lipid Panel  Lab Results   Component Value Date/Time    Cholesterol, total 107 07/25/2016 06:36 AM    HDL Cholesterol 36 07/25/2016 06:36 AM    LDL, calculated 37 07/25/2016 06:36 AM    Triglyceride 170 (H) 07/25/2016 06:36 AM    CHOL/HDL Ratio 3.0 07/25/2016 06:36 AM        Discharge Diagnosis: Bipolar 1 disorder (ICD-10-CM: F31.9)    Discharge Plan: Patient discharged via taxi to the [employer] for a bus back to [place].  DISCHARGE SUMMARY    NAME:Clarence Rodriguez  DOB: 11-21-1975  MRN: [id number]    The patient Mackenzy Eisenberg exhibits the ability to control behavior in a less restrictive environment.  Patient's level of functioning is improving.  No assaultive/destructive behavior has been observed for the past 24 hours.  No suicidal/homicidal threat or behavior has been observed for the past 24 hours.  There is no evidence  of serious medication side effects.  Patient has not been in physical or protective restraints for at least the past 24 hours.    If weapons involved, how are they secured? No weapons involved.    Is patient aware of and in agreement with discharge plan? Yes    Arrangements for medication:  Prescriptions given to patient.      Referral for substance abuse treatment? Patient is not using substances/Not applicable.    Referral for smoking cessation needed? Yes, patient declined.    Copy of discharge instructions to provider?: [employer] [phone])    Arrangements for transportation home:  Taxi to [employer] all follow up appointments as scheduled, continue to take prescribed medications per physician instructions.  Mental health crisis number:  325 or your local mental health crisis line number at [phone].    Discharge Medication List and Instructions:   Current Discharge Medication List      START taking these medications    Details   mupirocin (BACTROBAN) 2 % ointment Apply  to affected area two (2) times a day. Indications: Folliculitis  Qty: 22 g, Refills: 0      metFORMIN ER (GLUCOPHAGE XR) 500 mg tablet Take 1 Tab by mouth Daily (before breakfast). Indications: type 2 diabetes mellitus  Qty: 30 Tab, Refills: 0      dilTIAZem CD (CARDIZEM CD) 240 mg ER capsule Take 1 Cap by mouth daily. Indications: hypertension  Qty: 30 Cap, Refills: 0      DULoxetine (CYMBALTA) 20 mg capsule Take 1 Cap by mouth daily. Indications: ANXIETY WITH DEPRESSION  Qty: 30 Cap, Refills: 0      hydroCHLOROthiazide (HYDRODIURIL) 25 mg tablet Take 1 Tab by mouth daily. Indications: diabetes insipidus  Qty: 30 Tab, Refills: 0         CONTINUE these medications which have CHANGED    Details   lisinopril (PRINIVIL, ZESTRIL) 40 mg tablet Take 1 Tab by mouth daily. Indications: hypertension  Qty: 30 Tab, Refills: 0      montelukast (SINGULAIR) 10 mg tablet Take 1 Tab by mouth nightly. Indications: Allergic Rhinitis  Qty: 30 Tab, Refills: 0      pregabalin (LYRICA) 100 mg capsule Take 1 Cap by mouth two (2) times a day. Max Daily Amount: 200 mg. Indications: Generalized Anxiety Disorder  Qty: 60 Cap, Refills: 0    Associated Diagnoses: Bipolar 1 disorder (HCC)      cloNIDine HCl (CATAPRES) 0.1 mg tablet Take 1 Tab by mouth two (2) times a day. Indications: hypertension  Qty: 60 Tab, Refills: 0      ARIPiprazole (ABILIFY) 5 mg tablet Take 1 Tab by mouth daily. Indications: Schizophrenia  Qty: 30 Tab, Refills: 0         CONTINUE these medications which have NOT CHANGED    Details    divalproex DR (DEPAKOTE) 250 mg tablet Take 750 mg by mouth two (2) times a day.         STOP taking these medications       dilTIAZem ER (CARDIZEM LA) 240 mg Tb24 tablet Comments:   Reason for Stopping:         ibuprofen 200 mg cap Comments:   Reason for Stopping:         metFORMIN (GLUCOPHAGE) 500 mg tablet Comments:   Reason for Stopping:               Unresulted Labs     None  To obtain results of studies pending at discharge, please contact [phone]    Follow-up Information     Follow up With Details Comments [facility] on 07/30/2016 Walk-in Monday through Friday between 8:30am and 3:00pm to enroll in mental health, housing, case management, and counseling services[place] [address]  [phone]    None   None (395) Patient stated that they have no PCP            Advanced Directive:   Does the patient have an appointed surrogate decision maker? No  Does the patient have a Medical Advance Directive? No  Does the patient have a Psychiatric Advance Directive? No  If the patient does not have a surrogate or Medical Advance Directive AND Psychiatric Advance Directive, the patient was offered information on these advance directives Yes and Patient declined to complete    Patient Instructions: Please continue all medications until otherwise directed by physician.    What to do at Home:  Recommended activity: Activity as tolerated,     If you experience any of the following symptoms thoughts of harming self, feeling overwhelmed with loneliness or hopelessness, please follow up with your local crisis number.    *  Please give a list of your current medications to your Primary Care Provider.    *  Please update this list whenever your medications are discontinued, doses are      changed, or new medications (including over-the-counter products) are added.    *  Please carry medication information at all times in case of emergency situations.     Tobacco Cessation Discharge Plan:   Is the patient a smoker and needs referral for smoking cessation? Yes  Patient referred to the following for smoking cessation with an appointment? Refused    Patient was offered medication to assist with smoking cessation at discharge? Refused  Was education for smoking cessation added to the discharge instructions? Yes    Alcohol/Substance Abuse Discharge Plan:   Does the patient have a history of substance/alcohol abuse and requires a referral for treatment? No  Patient referred to the following for substance/alcohol abuse treatment with an appointment? Not applicable  Patient was offered medication to assist with alcohol cessation at discharge? Not applicable  Was education for substance/alcohol abuse added to discharge instructions? Not applicable    Patient discharged to Home; discussed with patient/caregiver and provided to the patient/caregiver either in hard copy or electronically.

## 2016-07-29 NOTE — Behavioral Health Treatment Team (Signed)
1832 [facility]. Patient reported "I understand my discharge instructions". Patient given discharge instructions with prescriptions. All personal belongings were returned to patient.
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
## 2017-07-12 NOTE — Progress Notes (Signed)
Date of previous inpatient admission/ ED visit?  Psych admission 07/24/16-07/29/16    What brought the patient back to ED?  Homelessness, left [facility] yesterday    Did patient decline recommended services during last admission/ ED visit (if yes, what)?Yes, AMA from [facility]    Has patient seen a provider since their last inpatient admission/ED visit (if yes, when)?no    CM Interventions:    Care Management Interventions  PCP Verified by CM: No  Palliative Care Criteria Met (RRAT>21 & CHF Dx)?: No  Mode of Transport at Discharge: (Round Trip)  Hospital Transport Time of Discharge: 1400  Transition of Care Consult (CM Consult): Discharge Planning(Resources given for homelessness)  Current Support Network: Other(Homeless)  Plan discussed with Pt/Family/Caregiver: Yes(Met with patient, resources given.  )  Discharge Location  Discharge Placement: Homeless([facility]    Round Trip set up for homeless patient and bus ticket given.  He plans to relocate to [place], daughter is pregnant with twins.  He is estranged from wife however the daughter's pregnancy has brought them closer.  He plans to get bus ticket to travel to [place].  He will go to [facility] today and tomorrow.    Francoise Schaumann, RN, BSN, [facility]  ED Care Management  [phone]

## 2017-07-12 NOTE — ED Notes (Signed)
Arrived via EMS for complaint of homelessness. Patient states he left AMA from [facility] yesterday because he did not want to stay in the hospital all weekend because "I didn't want to be bored".     Patient is requesting social work admit to figure out housing.

## 2017-07-12 NOTE — ED Provider Notes (Signed)
42 y.o. male with past medical history significant for depression, borderline personality disorder, mood disorder, h/o suicidal thoughts, diabetes, HTN, sleep disorder, s/p cholecystectomy, who presents to the ED via EMS, with chief complaint of suicidal ideation secondary to homeless situation. EMS personnel report that the patient has expressed suicidal ideation stemming from increased stress due to "family/social situation". They state that the patient is "insecure" about having a place to stay, and was discharged AMA from [facility] yesterday. Pt states that he left AMA because he "did not want to stay in the hospital all weekend". He believes now that the decision to leave was a bad idea, and is here to speak with a "social worker" for a "longer term solution regarding housing where I am not overwhelmed". EMS personnel reports that patient is cooperative. Pt specifically denies hallucinations or homicidal ideation. There are no other acute medical concerns at this time.     Social hx: Positive for Tobacco use (current every day smoker, 0.5 packs/day); Negative for EtOH use; Negative for Illicit Drug use  PCP: None    Note written by Myrle Sheng, Scribe, as dictated by Domingo Cocking, MD 12:10 PM      The history is provided by the patient, medical records and the EMS personnel. No language interpreter was used.        Past Medical History:   Diagnosis Date   ??? Aggressive outburst    ??? Diabetes (HCC)    ??? Hypertension    ??? Mood disorder (HCC)    ??? Sleep disorder    ??? Suicidal thoughts        No past surgical history on file.      No family history on file.    Social History     Socioeconomic History   ??? Marital status: SINGLE     Spouse name: Not on file   ??? Number of children: Not on file   ??? Years of education: Not on file   ??? Highest education level: Not on file   Social Needs   ??? Financial resource strain: Not on file   ??? Food insecurity - worry: Not on file    ??? Food insecurity - inability: Not on file   ??? Transportation needs - medical: Not on file   ??? Transportation needs - non-medical: Not on file   Occupational History   ??? Not on file   Tobacco Use   ??? Smoking status: Current Every Day Smoker   ??? Smokeless tobacco: Never Used   Substance and Sexual Activity   ??? Alcohol use: No   ??? Drug use: No   ??? Sexual activity: Not on file   Other Topics Concern   ??? Not on file   Social History Narrative    42 year old caucasian morbidly obese amle admitted on TDO for SI depression, in the context of being homeless after breaking up with hhis partner.Pt is diabetic and is non compliant with treatment,         ALLERGIES: Patient has no known allergies.    Review of Systems   Constitutional: Negative for diaphoresis and fever.   HENT: Negative for facial swelling.    Eyes: Negative for visual disturbance.   Respiratory: Negative for cough.    Cardiovascular: Negative for chest pain.   Gastrointestinal: Negative for abdominal pain.   Genitourinary: Negative for dysuria.   Musculoskeletal: Negative for joint swelling.   Skin: Negative for rash.   Neurological: Negative for headaches.  Hematological: Negative for adenopathy.   Psychiatric/Behavioral: Positive for suicidal ideas. Negative for hallucinations.        Negative for homicidal ideation.       Vitals:    07/12/17 1212   BP: 172/78   Pulse: (!) 107   Resp: 17   Temp: 98.1 ??F (36.7 ??C)   SpO2: 96%   Height: 5\' 11"  (1.803 m)            Physical Exam   Constitutional: He is oriented to person, place, and time. He appears well-developed. No distress.   Morbidly obese.   HENT:   Head: Normocephalic and atraumatic.   Mouth/Throat: Oropharynx is clear and moist.   Eyes: Pupils are equal, round, and reactive to light.   Neck: Normal range of motion. Neck supple.   Cardiovascular: Normal rate, regular rhythm, normal heart sounds and intact distal pulses.   Pulmonary/Chest: Effort normal and breath sounds normal. No respiratory  distress.   Abdominal: Soft. Bowel sounds are normal. He exhibits no distension. There is no tenderness.   Musculoskeletal: Normal range of motion. He exhibits no edema.   Neurological: He is alert and oriented to person, place, and time.   Skin: Skin is warm and dry.   Nursing note and vitals reviewed.  Note written by Myrle ShengAndrew Azdell, Scribe, as dictated by Domingo CockingMason, Lenny Fiumara T, MD 12:10 PM    MDM  Number of Diagnoses or Management Options  Homelessness:   Diagnosis management comments: Pt here for homelessness.  Denies SI/HI.  Left [facility] AMA yesterday.  Doesn't have a place to go.  No other medical complaints.  Case management consulted.         Procedures

## 2017-07-12 NOTE — ED Notes (Signed)
1300: Hourly rounding completed. Case management to arrange for resources.    1400:The MD has  reviewed discharge instructions with the patient.  The patient verbalized understanding. Patient escorted to waiting room to wait for lyft.

## 2017-07-13 ENCOUNTER — Inpatient Hospital Stay
Admit: 2017-07-13 | Discharge: 2017-07-12 | Disposition: A | Discharge status: Home / Self Care | Payer: MEDICAID | Attending: Emergency Medicine

## 2017-07-13 ENCOUNTER — Inpatient Hospital Stay
Admit: 2017-07-13 | Discharge: 2017-07-13 | Disposition: A | Discharge status: Home / Self Care | Payer: MEDICAID | Attending: Emergency Medicine

## 2017-07-13 DIAGNOSIS — F319 Bipolar disorder, unspecified: Secondary | ICD-10-CM

## 2017-07-13 DIAGNOSIS — R45851 Suicidal ideations: Secondary | ICD-10-CM

## 2017-07-13 LAB — DRUG SCREEN, URINE
AMPHETAMINES: NEGATIVE
BARBITURATES: NEGATIVE
BENZODIAZEPINES: NEGATIVE
COCAINE: NEGATIVE
METHADONE: NEGATIVE
OPIATES: NEGATIVE
PCP(PHENCYCLIDINE): NEGATIVE
THC (TH-CANNABINOL): NEGATIVE

## 2017-07-13 LAB — SALICYLATE: Salicylate level: <1.7 MG/DL — ABNORMAL LOW (ref 2.8–20.0)

## 2017-07-13 LAB — CBC WITH AUTOMATED DIFF
ABS. BASOPHILS: 0.1 10*3/uL (ref 0.0–0.1)
ABS. EOSINOPHILS: 0.2 10*3/uL (ref 0.0–0.4)
ABS. IMM. GRANS.: 0.1 10*3/uL — ABNORMAL HIGH (ref 0.00–0.04)
ABS. LYMPHOCYTES: 2.9 10*3/uL (ref 0.8–3.5)
ABS. MONOCYTES: 1.5 10*3/uL — ABNORMAL HIGH (ref 0.0–1.0)
ABS. NEUTROPHILS: 7.2 10*3/uL (ref 1.8–8.0)
ABSOLUTE NRBC: 0 10*3/uL (ref 0.00–0.01)
BASOPHILS: 0 % (ref 0–1)
EOSINOPHILS: 1 % (ref 0–7)
HCT: 41.3 % (ref 36.6–50.3)
HGB: 13.2 g/dL (ref 12.1–17.0)
IMMATURE GRANULOCYTES: 1 % — ABNORMAL HIGH (ref 0.0–0.5)
LYMPHOCYTES: 24 % (ref 12–49)
MCH: 29.8 PG (ref 26.0–34.0)
MCHC: 32 g/dL (ref 30.0–36.5)
MCV: 93.2 FL (ref 80.0–99.0)
MONOCYTES: 13 % (ref 5–13)
MPV: 9.3 FL (ref 8.9–12.9)
NEUTROPHILS: 61 % (ref 32–75)
NRBC: 0 PER 100 WBC
PLATELET: 209 10*3/uL (ref 150–400)
RBC: 4.43 M/uL (ref 4.10–5.70)
RDW: 14.4 % (ref 11.5–14.5)
WBC: 11.9 10*3/uL — ABNORMAL HIGH (ref 4.1–11.1)

## 2017-07-13 LAB — METABOLIC PANEL, COMPREHENSIVE
A-G Ratio: 0.8 — ABNORMAL LOW (ref 1.1–2.2)
ALT (SGPT): 28 U/L (ref 12–78)
AST (SGOT): 20 U/L (ref 15–37)
Albumin: 3.2 g/dL — ABNORMAL LOW (ref 3.5–5.0)
Alk. phosphatase: 110 U/L (ref 45–117)
Anion gap: 3 mmol/L — ABNORMAL LOW (ref 5–15)
BUN/Creatinine ratio: 15 (ref 12–20)
BUN: 11 MG/DL (ref 6–20)
Bilirubin, total: 0.3 MG/DL (ref 0.2–1.0)
CO2: 26 mmol/L (ref 21–32)
Calcium: 8.8 MG/DL (ref 8.5–10.1)
Chloride: 108 mmol/L (ref 97–108)
Creatinine: 0.71 MG/DL (ref 0.70–1.30)
GFR est AA: >60 mL/min/{1.73_m2} (ref >60)
GFR est non-AA: >60 mL/min/{1.73_m2} (ref >60)
Globulin: 4.1 g/dL — ABNORMAL HIGH (ref 2.0–4.0)
Glucose: 101 mg/dL — ABNORMAL HIGH (ref 65–100)
Potassium: 3.4 mmol/L — ABNORMAL LOW (ref 3.5–5.1)
Protein, total: 7.3 g/dL (ref 6.4–8.2)
Sodium: 137 mmol/L (ref 136–145)

## 2017-07-13 LAB — URINALYSIS W/ REFLEX CULTURE
Bacteria: NEGATIVE /hpf
Bilirubin: NEGATIVE
Blood: NEGATIVE
Glucose: NEGATIVE mg/dL
Ketone: NEGATIVE mg/dL
Leukocyte Esterase: NEGATIVE
Nitrites: NEGATIVE
Protein: NEGATIVE mg/dL
Specific gravity: 1.013 (ref 1.003–1.030)
Urobilinogen: 0.2 EU/dL (ref 0.2–1.0)
pH (UA): 6.5 (ref 5.0–8.0)

## 2017-07-13 LAB — ACETAMINOPHEN: Acetaminophen level: <2 ug/mL — ABNORMAL LOW (ref 10–30)

## 2017-07-13 LAB — ETHYL ALCOHOL: ALCOHOL(ETHYL),SERUM: <10 MG/DL (ref <10)

## 2017-07-13 MED ORDER — IBUPROFEN 400 MG TAB
400 mg | ORAL | Status: AC
Start: 2017-07-13 — End: 2017-07-13
  Administered 2017-07-13: 09:00:00 via ORAL

## 2017-07-13 MED FILL — IBUPROFEN 400 MG TAB: 400 mg | ORAL | Qty: 2

## 2017-07-13 NOTE — ED Triage Notes (Addendum)
Triage:  Pt arrives from home with CC of suicidal ideation that began about an hour ago.  Pt reports if he were to kill himself he would overdose on his medications.  Pt reports increased stress recently secondary to homelessness.  [facility] this morning for ankle pain.
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
## 2017-07-13 NOTE — ED Notes (Signed)
12:53 PM  Pt re-evaluated by [facility].    No longer suicidal.  Pt expressing frustrating at having to wait for SSI check.  [facility].  Pt intends to get on bus to [place] in the morning to be closer to family.

## 2017-07-13 NOTE — ED Notes (Signed)
I have reviewed discharge instructions with the patient.  The patient verbalized understanding. Time allotted for questions. Pt to stay in waiting room till around 1900 then will get a cab ride [facility].

## 2017-07-13 NOTE — ED Provider Notes (Signed)
HPI     The patient is a 42 year old male with a past medical history significant for diabetes, hypertension, mood disorder, bipolar disorder, aggressive outbursts, suicidal ideation and overdose, status post cholecystectomy, who presents to the ED by EMS for evaluation for suicidal thoughts that began approximately one hour prior to arrival to the ED. The patient admits to having a plan to overdose and take all his medications. The patient also admits to being depressed, homeless and having difficulty getting back to his feet. The patient also complains of right knee pain. He was just evaluated for the same symptoms at [facility] where he was given an x-ray then Tylenol for this pain. The patient denies any headache, blurred vision, sore throat, cough, congestion, chest pain, shortness of breath, nausea, vomiting, abdominal pain, diarrhea, constipation, dysuria, hematuria, dizziness, extremity weakness or numbness, skin rash, sick contacts, homicidal ideation, visual and auditory hallucinations.    Past Medical History:   Diagnosis Date   ??? Aggressive outburst    ??? Diabetes (HCC)    ??? Hypertension    ??? Mood disorder (HCC)    ??? Psychiatric disorder     depression and borderline personality disorder   ??? Sleep disorder    ??? Suicidal thoughts        Past Surgical History:   Procedure Laterality Date   ??? HX CHOLECYSTECTOMY     ??? HX GI      lap band insertion and removal         History reviewed. No pertinent family history.    Social History     Socioeconomic History   ??? Marital status: SINGLE     Spouse name: Not on file   ??? Number of children: Not on file   ??? Years of education: Not on file   ??? Highest education level: Not on file   Social Needs   ??? Financial resource strain: Not on file   ??? Food insecurity - worry: Not on file   ??? Food insecurity - inability: Not on file   ??? Transportation needs - medical: Not on file   ??? Transportation needs - non-medical: Not on file   Occupational History    ??? Not on file   Tobacco Use   ??? Smoking status: Current Every Day Smoker     Packs/day: 0.50   ??? Smokeless tobacco: Never Used   Substance and Sexual Activity   ??? Alcohol use: No   ??? Drug use: No   ??? Sexual activity: Not on file   Other Topics Concern   ??? Not on file   Social History Narrative    42 year old caucasian morbidly obese amle admitted on TDO for SI depression, in the context of being homeless after breaking up with hhis partner.Pt is diabetic and is non compliant with treatment,         ALLERGIES: Patient has no known allergies.    Review of Systems   All other systems reviewed and are negative.      There were no vitals filed for this visit.         Physical Exam   Nursing note and vitals reviewed.     CONSTITUTIONAL: Well-appearing; well-nourished; in no apparent distress  HEAD: Normocephalic; atraumatic  EYES: PERRL; EOM intact; conjunctiva and sclera are clear bilaterally.  ENT: No rhinorrhea; normal pharynx with no tonsillar hypertrophy; mucous membranes pink/moist, no erythema, no exudate.  NECK: Supple; non-tender; no cervical lymphadenopathy  CARD: Normal S1,  S2; no murmurs, rubs, or gallops. Regular rate and rhythm.  RESP: Normal respiratory effort; breath sounds clear and equal bilaterally; no wheezes, rhonchi, or rales.  ABD: Normal bowel sounds; non-distended; non-tender; no palpable organomegaly, no masses, no bruits.  Back Exam: Normal inspection; no vertebral point tenderness, no CVA tenderness. Normal range of motion.  EXT: Normal ROM in all four extremities; non-tender to palpation; no swelling or deformity; distal pulses are normal, no edema.  SKIN: Warm; dry; no rash.  NEURO:Alert and oriented x 3, coherent, NII-XII grossly intact, sensory and motor are non-focal.  Psych exam: flat affect and depressed mood. Patient admits to suicidal ideation, but denies any homicidal ideation      MDM  Number of Diagnoses or Management Options   Diagnosis management comments: Assessment: Chronic right knee pain/ suicidal ideation/ depression/ homelessness    plan: lab/ Motrin/ [facility] consult for psychiatric evaluation and treatment recommendation       Amount and/or Complexity of Data Reviewed  Clinical lab tests: ordered and reviewed  Tests in the radiology section of CPT??: ordered and reviewed  Tests in the medicine section of CPT??: reviewed and ordered  Discussion of test results with the performing providers: yes  Decide to obtain previous medical records or to obtain history from someone other than the patient: yes  Obtain history from someone other than the patient: yes  Review and summarize past medical records: yes  Discuss the patient with other providers: yes  Independent visualization of images, tracings, or specimens: yes    Risk of Complications, Morbidity, and/or Mortality  Presenting problems: moderate  Diagnostic procedures: moderate  Management options: moderate    Patient Progress  Patient progress: stable         Procedures     PROGRESS NOTE:    Pt has been reexamined by Loni BeckwithAzie, Kalinda Romaniello, MD all available results have been reviewed with pt and any available family. The patient was medically cleared by me. [facility] who after discussion with the on-call psychiatrist, recommends admission to the hospital.  Pt understands sx, dx, and tx in ED. Care plan has been outlined and questions have been answered. Pt and any available family understands and agrees to need for admission to hospital for further tx not available in ED. Pt is ready for admission.    Written by Loni BeckwithWilliam Kacy Conely, MD,  6:16 AM    .
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
## 2017-07-13 NOTE — ED Notes (Signed)
Cab ride set up to [address] [phone]

## 2017-07-13 NOTE — Other (Addendum)
6:49 am Information faxed to Katrina with [facility] for admission consideration.  She reported that the only   potential bed [facility] at [facility].  The patient reported that he was willing to go if they had a bed.    6:59 am [facility] for [facility].     7:20 am Per Deanna ArtisKeisha with [facility], they are at capacity.    7:26 am [facility]ristie [facility], they have no appropriate beds.    7:27 am Per Dillard [facility]Kersha at [facility], they have no appropriate beds.    7:29 am Per France RavensMercedes at [facility], they have no appropriate beds.

## 2017-07-14 ENCOUNTER — Inpatient Hospital Stay
Admit: 2017-07-14 | Discharge: 2017-07-14 | Disposition: A | Discharge status: Home / Self Care | Payer: MEDICAID | Attending: Emergency Medicine

## 2017-07-14 DIAGNOSIS — S86911A Strain of unspecified muscle(s) and tendon(s) at lower leg level, right leg, initial encounter: Secondary | ICD-10-CM

## 2017-07-14 MED ORDER — IBUPROFEN 400 MG TAB
400 mg | Freq: Once | ORAL | Status: AC
Start: 2017-07-14 — End: 2017-07-14
  Administered 2017-07-14: 07:00:00 via ORAL

## 2017-07-14 MED FILL — IBUPROFEN 400 MG TAB: 400 mg | ORAL | Qty: 2

## 2017-07-14 NOTE — ED Provider Notes (Signed)
EMERGENCY DEPARTMENT HISTORY AND PHYSICAL EXAM      Date: 07/14/2017  Patient Name: Clarence Rodriguez    History of Presenting Illness     Chief Complaint   Patient presents with   ??? Knee Pain       History Provided By: Patient    HPI: Giavonni Fonder, 42 y.o. male with PMHx significant for HTN, DM, depression, borderline personality disorder, presents via EMS to the ED with cc of recently worsening acute on chronic right knee pain s/p injury 2 days ago. The pt reports that he has a misstep, twisted his right ankle and "popped" his right knee. He states that his knee "popped back into place" immediately afterwards. Pt relays that he was seen at a hospital in [place] on the day of the injury. Xray imaging did not show any significant findings. The pt reports his knee pain is chronic x 1.5 years but worsened significantly after the injury 2 days ago. He also c/o nasal congestion. The pt denies taking any pain medication for relief. He also denies any Orthopedic follow-up for symptoms.    There are no other complaints, changes, or physical findings at this time.    PCP: None    Current Facility-Administered Medications on File Prior to Encounter   Medication Dose Route Frequency Provider Last Rate Last Dose   ??? [COMPLETED] ibuprofen (MOTRIN) tablet 800 mg  800 mg Oral NOW Loni Beckwith, MD   800 mg at 07/13/17 4540     Current Outpatient Medications on File Prior to Encounter   Medication Sig Dispense Refill   ??? furosemide (LASIX) 20 mg tablet Take  by mouth two (2) times a day.     ??? liraglutide (VICTOZA) 0.6 mg/0.1 mL (18 mg/3 mL) pnij 1.8 mg by SubCUTAneous route daily.     ??? lisinopril (PRINIVIL, ZESTRIL) 40 mg tablet Take 1 Tab by mouth daily. Indications: hypertension 30 Tab 0   ??? montelukast (SINGULAIR) 10 mg tablet Take 1 Tab by mouth nightly. Indications: Allergic Rhinitis 30 Tab 0   ??? dilTIAZem CD (CARDIZEM CD) 240 mg ER capsule Take 1 Cap by mouth daily. Indications: hypertension 30 Cap 0    ??? cloNIDine HCl (CATAPRES) 0.1 mg tablet Take 1 Tab by mouth two (2) times a day. Indications: hypertension 60 Tab 0   ??? ARIPiprazole (ABILIFY) 5 mg tablet Take 1 Tab by mouth daily. Indications: Schizophrenia 30 Tab 0   ??? DULoxetine (CYMBALTA) 20 mg capsule Take 1 Cap by mouth daily. Indications: ANXIETY WITH DEPRESSION 30 Cap 0       Past History     Past Medical History:  Past Medical History:   Diagnosis Date   ??? Aggressive outburst    ??? Diabetes (HCC)    ??? Hypertension    ??? Mood disorder (HCC)    ??? Psychiatric disorder     depression and borderline personality disorder   ??? Sleep disorder    ??? Suicidal thoughts        Past Surgical History:  Past Surgical History:   Procedure Laterality Date   ??? HX CHOLECYSTECTOMY     ??? HX GI      lap band insertion and removal       Family History:  History reviewed. No pertinent family history.    Social History:  Social History     Tobacco Use   ??? Smoking status: Current Every Day Smoker     Packs/day: 0.50   ??? Smokeless tobacco: Never  Used   Substance Use Topics   ??? Alcohol use: No   ??? Drug use: No       Allergies:  Allergies   Allergen Reactions   ??? Augmentin [Amoxicillin-Pot Clavulanate] Diarrhea   ??? Chantix [Varenicline] Other (comments)     "Bad dreams"   ??? Codeine Itching   ??? Geodon [Ziprasidone Hcl] Other (comments)     Bronchitis   ??? Haldol [Haloperidol Lactate] Anxiety   ??? Morphine Itching         Review of Systems   Review of Systems   Constitutional: Negative for chills and fever.   HENT: Positive for congestion. Negative for rhinorrhea and sore throat.    Respiratory: Negative for cough and shortness of breath.    Cardiovascular: Negative for chest pain.   Gastrointestinal: Negative for abdominal pain, nausea and vomiting.   Genitourinary: Negative for dysuria and urgency.   Musculoskeletal: Positive for arthralgias (R knee).   Skin: Negative for rash.   Neurological: Negative for dizziness, light-headedness and headaches.    All other systems reviewed and are negative.      Physical Exam   Physical Exam   Constitutional: He is oriented to person, place, and time. He appears well-developed and well-nourished. No distress.   Ambulating without difficulty   HENT:   Head: Normocephalic and atraumatic.   Eyes: Conjunctivae and EOM are normal. Pupils are equal, round, and reactive to light.   Neck: Normal range of motion.   Cardiovascular: Normal rate, regular rhythm and intact distal pulses.   Pulmonary/Chest: Effort normal and breath sounds normal. No stridor. No respiratory distress.   Abdominal: Soft. He exhibits no distension. There is no tenderness.   Musculoskeletal: Normal range of motion.        Right knee: He exhibits normal range of motion, no ecchymosis, no deformity, no laceration and no erythema. Tenderness (diffuse ttp) found.   Neurological: He is alert and oriented to person, place, and time.   Skin: Skin is warm and dry.   Psychiatric: He has a normal mood and affect.   Nursing note and vitals reviewed.      Medical Decision Making   I am the first provider for this patient.    I reviewed the vital signs, available nursing notes, past medical history, past surgical history, family history and social history.    Vital Signs-Reviewed the patient's vital signs.  Patient Vitals for the past 12 hrs:   Temp Pulse Resp BP SpO2   07/14/17 0200 ??? ??? ??? 130/82 99 %   07/14/17 0141 99.8 ??F (37.7 ??C) (!) 107 22 (!) 157/98 99 %       Pulse Oximetry Analysis - 99% on RA    Records Reviewed: Nursing Notes and Old Medical Records    Provider Notes (Medical Decision Making):   DDx: acute on chronic knee pain. No xray given recent reported normal result. Will tx pain, apply ACE. Instructed importance of outpt follow up    ED Course:   Initial assessment performed. The patients presenting problems have been discussed, and they are in agreement with the care plan formulated and  outlined with them.  I have encouraged them to ask questions as they arise throughout their visit.    Pt able to ambulate without difficulty      Disposition:  Discharge Note:  2:30 AM  The patient has been re-evaluated and is ready for discharge. Reviewed available results with patient. Counseled patient/parent/guardian on diagnosis and care plan.  Patient has expressed understanding, and all questions have been answered. Patient agrees with plan and agrees to follow up as recommended, or return to the ED if their symptoms worsen. Discharge instructions have been provided and explained to the patient, along with reasons to return to the ED.    PLAN:  1. Discharge  Current Discharge Medication List        2.   Follow-up Information     Follow up With Specialties Details Why Contact Info    PCP from list  Schedule an appointment as soon as possible for a visit      [facility] Emergency Medicine  As needed, If symptoms worsen [address]  [place] [id number]  [phone]        Return to ED if worse     Diagnosis     Clinical Impression:   1. Knee strain, right, initial encounter        Attestations:    This note is prepared by Nils Flackaruni Maganti, acting as Neurosurgeoncribe for K. Guy SandiferShelby Deryl Ports, MD    K. Guy SandiferShelby Salayah Meares, MD: The scribe's documentation has been prepared under my direction and personally reviewed by me in its entirety. I confirm that the note above accurately reflects all work, treatment, procedures, and medical decision making performed by me.

## 2017-07-14 NOTE — ED Notes (Signed)
Patient arrived via EMS from a homeless shelter for knee pain: Patient informed at discharge that he needed to update his insurance information prior to discharge because roundtrip would not be able to book the transportation. The insurance company would not authorize it unless it is the same address they have on file. (The patient wanted to be dropped off at a [employer] and then stated he would pay for himself to get to where he was going from there.) The patient became belligerent and disrespectful with staff. The trip was booked anyway in the roundtrip system and was denied by the insurance company. Once the insurance company denied the trip the patient was informed and immediately began to yell and use profanity towards staff in the waiting area. He was informed that the insurance company stated that he needed to call them and give them his updated information so they could update there system(he was offered a hospital telephone line to phone the insurance company) and he just kept saying we need to call. He was informed several times the insurance company stated only he could call. All he kept saying was that I am homeless and we need to do it. Security was informed of the situation and asked to remove the patient from the waiting area. [employer] was also asked to remove the patient from the waiting area. He would not leave he just kept saying he is homeless. He was given a bus ticket at [phone] to catch the bus.
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
## 2017-07-14 NOTE — ED Triage Notes (Signed)
Pt comes in via EMS from homeless shelter with R knee pain x 2 days and cough.

## 2017-07-14 NOTE — ED Notes (Signed)
Discharge instructions were given to the patient by Megumi A Miyajima-Olguin, RN.     The patient left the Emergency Department ambulatory, alert and oriented and in no acute distress with 0 prescriptions. The patient was encouraged to call or return to the ED for worsening issues or problems and was encouraged to schedule a follow up appointment for continuing care.     The patient verbalized understanding of discharge instructions and prescriptions, all questions were answered. The patient has no further concerns at this time.

## 2020-01-14 ENCOUNTER — Emergency Department (HOSPITAL_COMMUNITY)
Admission: EM | Admit: 2020-01-14 | Discharge: 2020-01-15 | Disposition: A | Discharge status: Home / Self Care | Payer: 59 | Attending: Emergency Medicine | Admitting: Emergency Medicine

## 2020-01-14 ENCOUNTER — Encounter (HOSPITAL_COMMUNITY): Payer: Self-pay

## 2020-01-14 DIAGNOSIS — Z590 Homelessness: Secondary | ICD-10-CM | POA: Insufficient documentation

## 2020-01-14 DIAGNOSIS — F17210 Nicotine dependence, cigarettes, uncomplicated: Secondary | ICD-10-CM | POA: Diagnosis not present

## 2020-01-14 DIAGNOSIS — F4320 Adjustment disorder, unspecified: Secondary | ICD-10-CM | POA: Insufficient documentation

## 2020-01-14 DIAGNOSIS — Z20822 Contact with and (suspected) exposure to covid-19: Secondary | ICD-10-CM | POA: Diagnosis not present

## 2020-01-14 DIAGNOSIS — M7989 Other specified soft tissue disorders: Secondary | ICD-10-CM | POA: Diagnosis not present

## 2020-01-14 DIAGNOSIS — R45851 Suicidal ideations: Secondary | ICD-10-CM | POA: Insufficient documentation

## 2020-01-14 DIAGNOSIS — R600 Localized edema: Secondary | ICD-10-CM | POA: Diagnosis not present

## 2020-01-14 DIAGNOSIS — I509 Heart failure, unspecified: Secondary | ICD-10-CM | POA: Insufficient documentation

## 2020-01-14 DIAGNOSIS — L539 Erythematous condition, unspecified: Secondary | ICD-10-CM | POA: Insufficient documentation

## 2020-01-14 DIAGNOSIS — I890 Lymphedema, not elsewhere classified: Secondary | ICD-10-CM

## 2020-01-14 HISTORY — DX: Heart failure, unspecified: I50.9

## 2020-01-14 HISTORY — DX: Methicillin resistant Staphylococcus aureus infection, unspecified site: A49.02

## 2020-01-14 LAB — CBC
HCT: 38.7 % — ABNORMAL LOW (ref 39.0–52.0)
Hemoglobin: 11.4 g/dL — ABNORMAL LOW (ref 13.0–17.0)
MCH: 26.5 pg (ref 26.0–34.0)
MCHC: 29.5 g/dL — ABNORMAL LOW (ref 30.0–36.0)
MCV: 90 fL (ref 80.0–100.0)
Platelets: 194 10*3/uL (ref 150–400)
RBC: 4.3 MIL/uL (ref 4.22–5.81)
RDW: 14.8 % (ref 11.5–15.5)
WBC: 9.1 10*3/uL (ref 4.0–10.5)
nRBC: 0 % (ref 0.0–0.2)

## 2020-01-14 LAB — HEPATIC FUNCTION PANEL
ALT: 21 U/L (ref 0–44)
AST: 18 U/L (ref 15–41)
Albumin: 3.1 g/dL — ABNORMAL LOW (ref 3.5–5.0)
Alkaline Phosphatase: 69 U/L (ref 38–126)
Bilirubin, Direct: 0.1 mg/dL (ref 0.0–0.2)
Indirect Bilirubin: 0.3 mg/dL (ref 0.3–0.9)
Total Bilirubin: 0.4 mg/dL (ref 0.3–1.2)
Total Protein: 7 g/dL (ref 6.5–8.1)

## 2020-01-14 LAB — BASIC METABOLIC PANEL WITH GFR
Anion gap: 10 (ref 5–15)
BUN: 10 mg/dL (ref 6–20)
CO2: 28 mmol/L (ref 22–32)
Calcium: 8.7 mg/dL — ABNORMAL LOW (ref 8.9–10.3)
Chloride: 100 mmol/L (ref 98–111)
Creatinine, Ser: 0.97 mg/dL (ref 0.61–1.24)
GFR calc Af Amer: >60 mL/min
GFR calc non Af Amer: >60 mL/min
Glucose, Bld: 138 mg/dL — ABNORMAL HIGH (ref 70–99)
Potassium: 4.2 mmol/L (ref 3.5–5.1)
Sodium: 138 mmol/L (ref 135–145)

## 2020-01-14 LAB — CBG MONITORING, ED
Glucose-Capillary: 104 mg/dL — ABNORMAL HIGH (ref 70–99)
Glucose-Capillary: 136 mg/dL — ABNORMAL HIGH (ref 70–99)

## 2020-01-14 LAB — ETHANOL: Alcohol, Ethyl (B): <10 mg/dL (ref <10)

## 2020-01-14 LAB — BRAIN NATRIURETIC PEPTIDE: B Natriuretic Peptide: 22.1 pg/mL (ref 0.0–100.0)

## 2020-01-14 LAB — SARS CORONAVIRUS 2 BY RT PCR (HOSPITAL ORDER, PERFORMED IN ~~LOC~~ HOSPITAL LAB): SARS Coronavirus 2: NEGATIVE

## 2020-01-14 MED ORDER — FUROSEMIDE 20 MG PO TABS: 40.0000 mg | ORAL_TABLET | Freq: Two times a day (BID) | ORAL | Status: DC

## 2020-01-14 MED ORDER — ATORVASTATIN CALCIUM 40 MG PO TABS: 40.0000 mg | ORAL_TABLET | Freq: Every evening | ORAL | Status: DC

## 2020-01-14 MED ORDER — ACETAMINOPHEN 325 MG PO TABS
650.0000 mg | ORAL_TABLET | Freq: Once | ORAL | Status: AC
  Administered 2020-01-14: 650 mg via ORAL
  Filled 2020-01-14: qty 2

## 2020-01-14 MED ORDER — DIVALPROEX SODIUM ER 500 MG PO TB24
500.0000 mg | ORAL_TABLET | Freq: Two times a day (BID) | ORAL | Status: DC
  Administered 2020-01-15: 500 mg via ORAL
  Filled 2020-01-14 (×2): qty 1

## 2020-01-14 MED ORDER — MUPIROCIN 2 % EX OINT
TOPICAL_OINTMENT | Freq: Two times a day (BID) | CUTANEOUS | Status: DC
  Filled 2020-01-14 (×2): qty 22

## 2020-01-14 MED ORDER — ALBUTEROL SULFATE HFA 108 (90 BASE) MCG/ACT IN AERS: 2.0000 | INHALATION_SPRAY | Freq: Four times a day (QID) | RESPIRATORY_TRACT | Status: DC | PRN

## 2020-01-14 MED ORDER — CARVEDILOL 3.125 MG PO TABS: 6.2500 mg | ORAL_TABLET | Freq: Two times a day (BID) | ORAL | Status: DC

## 2020-01-14 MED ORDER — APIXABAN 5 MG PO TABS
5.0000 mg | ORAL_TABLET | Freq: Two times a day (BID) | ORAL | Status: DC
  Administered 2020-01-15: 5 mg via ORAL
  Filled 2020-01-14: qty 1

## 2020-01-14 MED ORDER — ACETAMINOPHEN 325 MG PO TABS
650.0000 mg | ORAL_TABLET | Freq: Four times a day (QID) | ORAL | Status: DC | PRN
  Administered 2020-01-15: 650 mg via ORAL
  Filled 2020-01-14: qty 2

## 2020-01-14 NOTE — ED Notes (Signed)
Pt stated he felt dizzy. This NT checked pts CBG -104.

## 2020-01-14 NOTE — ED Notes (Signed)
Pt walked to vending machine and got a snack and orange juice.

## 2020-01-14 NOTE — ED Provider Notes (Signed)
  [name] EMERGENCY DEPARTMENT Provider Note  Care of patient assumed from outgoing provider Arthor Captain at 11:43 PM.  See their note for further details of patient presentation and ED course.  Briefly, 44 y.o. male with PMH below presents with leg pain secondary to his chronic venous stasis dermatitis, when discharge discussed, patient states that he is homeless and will kill himself by overdose when he goes home.  Psych consulted the low suspicion for actual suicidality, suspect malingering.  Past Medical History:  Diagnosis Date  . CHF (congestive heart failure) (HCC)   . MRSA (methicillin resistant Staphylococcus aureus)    Vitals:   01/14/20 1158 01/14/20 2025  BP: (!) 142/97 127/72  Pulse: 97 94  Resp: 20 18  Temp: 97.7 F (36.5 C)   SpO2: 97% 96%    Plan at time of Handoff:  We will follow up psychiatric assessment.   MDM/ED Course:    Patient reassessed immediately following handoff.  Agree the bilateral lower extremities below the knee are swollen and somewhat erythematous though not warm and not really consistent of infection, suspect chronic lymphedema the patient is likely high risk for infection, unlikely that he has cellulitis that is symmetric in bilateral without any warmth.  I have a relatively high suspicion for malingering for the patient's expressed suicidal ideation and unfortunately patient is consistently asking for medications for pain and/or admission any means necessary, will administer Tylenol.  BH assessment shows low concern for active suicidality, will reassess overnight and in the AM.  Impression: On re-evaluation patients VS were stable  I discussed all relevant finding and clinical impression to the patient. The patient expressed understanding of their condition as well as the disposition proposed and expressed agreement.  The above statements and care plan were discussed with my attending physician Dr Hyacinth Meeker.  Clinical  Impression: 1. Bilateral lower extremity edema   2. Lymphedema   3. Suicidal ideation     Data Unavailable  Labs, studies and imaging reviewed by myself and considered in medical decision making if ordered. Imaging interpreted by radiology. Pt was discussed with my attending, Dr. Hyacinth Meeker  Electronically signed by:  Christiane Ha Redding8/27/202111:43 PM      Loree Fee, MD 01/14/20 Ouida Sills    Eber Hong, MD 01/17/20 [phone]

## 2020-01-14 NOTE — ED Provider Notes (Signed)
[name] EMERGENCY DEPARTMENT Provider Note   CSN: [id number] Arrival date & time: 01/14/20  1884     History Chief Complaint  Patient presents with  . Leg Swelling    Clayton Cameron is a 44 y.o. male past medical history of CHF, MRSA and morbid obesity who presents with 2 complaints.  First the patient complains of chronic leg swelling.  He states he has been taking Lasix and was prescribed doxycycline and recently completed a course however "it is not helping me."  He states that he knows he has MRSA and clindamycin is the only thing that will work.  He denies fevers, chills. Secondly the patient states that he is homeless and suicidal and he plans to overdose on medicine.  He denies homicidal ideation or audiovisual hallucinations.  HPI     Past Medical History:  Diagnosis Date  . CHF (congestive heart failure) (HCC)   . MRSA (methicillin resistant Staphylococcus aureus)     There are no problems to display for this patient.   Past Surgical History:  Procedure Laterality Date  . CHOLECYSTECTOMY    . LAPAROSCOPIC GASTRIC BANDING    . LAPAROSCOPIC REPAIR AND REMOVAL OF GASTRIC BAND         No family history on file.  Social History   Tobacco Use  . Smoking status: Current Every Day Smoker    Packs/day: 0.50  . Smokeless tobacco: Never Used  Substance Use Topics  . Alcohol use: Never  . Drug use: Never    Home Medications Prior to Admission medications   Not on File    Allergies    Augmentin [amoxicillin-pot clavulanate], Geodon [ziprasidone], and Haldol [haloperidol]  Review of Systems   Review of Systems Ten systems reviewed and are negative for acute change, except as noted in the HPI.   Physical Exam Updated Vital Signs BP (!) 142/97 (BP Location: Right Arm)   Pulse 97   Temp 97.7 F (36.5 C) (Oral)   Resp 20   Ht 5\' 11"  (1.803 m)   Wt (!) 184.6 kg   SpO2 97%   BMI 56.76 kg/m   Physical Exam Vitals and nursing  note reviewed.  Constitutional:      General: He is not in acute distress.    Appearance: He is well-developed. He is not diaphoretic.  HENT:     Head: Normocephalic and atraumatic.  Eyes:     General: No scleral icterus.    Conjunctiva/sclera: Conjunctivae normal.  Cardiovascular:     Rate and Rhythm: Normal rate and regular rhythm.     Heart sounds: Normal heart sounds.  Pulmonary:     Effort: Pulmonary effort is normal. No respiratory distress.     Breath sounds: Normal breath sounds.  Abdominal:     Palpations: Abdomen is soft.     Tenderness: There is no abdominal tenderness.  Musculoskeletal:     Cervical back: Normal range of motion and neck supple.     Right lower leg: Edema present.     Left lower leg: Edema present.     Comments: Bilateral lower extremity edema, 4+.  There is mild, pinkish, erythema and weeping.  Tender to palpation.  Skin:    General: Skin is warm and dry.  Neurological:     Mental Status: He is alert.  Psychiatric:        Behavior: Behavior normal.     ED Results / Procedures / Treatments   Labs (all labs ordered are listed,  but only abnormal results are displayed) Labs Reviewed  CBC - Abnormal; Notable for the following components:      Result Value   Hemoglobin 11.4 (*)    HCT 38.7 (*)    MCHC 29.5 (*)    All other components within normal limits  BASIC METABOLIC PANEL - Abnormal; Notable for the following components:   Glucose, Bld 138 (*)    Calcium 8.7 (*)    All other components within normal limits  CBG MONITORING, ED - Abnormal; Notable for the following components:   Glucose-Capillary 104 (*)    All other components within normal limits  CBG MONITORING, ED - Abnormal; Notable for the following components:   Glucose-Capillary 136 (*)    All other components within normal limits  HEPATIC FUNCTION PANEL  BRAIN NATRIURETIC PEPTIDE    EKG None  Radiology No results found.  Procedures Procedures (including critical care  time)  Medications Ordered in ED Medications - No data to display  ED Course  I have reviewed the triage vital signs and the nursing notes.  Pertinent labs & imaging results that were available during my care of the patient were reviewed by me and considered in my medical decision making (see chart for details).    MDM Rules/Calculators/A&P                         44 year old male with morbid obesity. I explained to the patient that his exam findings are consistent with venous stasis dermatitis and that I think he would benefit highly from Unna boots.  The patient replied that his legs hurt too much and that he is homeless.  I told him that we could with the Unna boots on here and the compression and salve should help soothe his skin and decrease all of that swelling which would result in less pain however the patient adamantly refused that intervention and states that he is homeless and has nowhere to go and that if he is discharged he will kill himself by overdosing on his medications. I have strong suspicion that Patient's complaint about suicidality is directly related to the fact that he is homeless and does not want to leave. I also have very low suspicion of completed suicide in this patient, However I have ordered screening labs and will have the patient Evaluated by our psych team.   I do not see any evidence of cellulitis.  He does have some crusting on the left leg which appears to be consistent with dried serous fluid however I will apply Bactroban ointment to the lower extremities.  I still believe the patient would benefit highly from [employer].  I have placed a TTS consult and feel that if secondary provider agrees he is able for discharge that he is safe to go.  I have ordered all screening labs for med clearance.  I have given signout to Dr. Loree Fee who will assume care of the patient.  Final Clinical Impression(s) / ED Diagnoses Final diagnoses:  None    Rx / DC  Orders ED Discharge Orders    None       Arthor Captain, PA-C 01/14/20 1506    Sabino Donovan, MD 01/14/20 [phone]

## 2020-01-14 NOTE — ED Notes (Addendum)
Pt is now feeling suicidal d/t pain to BLE. Pt states I have set here for hours and I can't take the pain anymore

## 2020-01-14 NOTE — BH Assessment (Addendum)
Comprehensive Clinical Assessment (CCA) Screening, Triage and Referral Note  01/14/2020 Clayton Cameron [id number] Patient presents this date with ongoing pain management issues. Patient reports thoughts of "thinking about hurting himself" although does not voice any immediate plan or intent. Patient denies any H/I or AVH. Patient initially presented for ongoing chronic swollen legs although later stated he is from [place] and is homeless. Patient also reported his pain "was so bad" that "he wanted to die." Patient denies any previous attempts or gestures at self harm. Patient denies any SA issues. Patient denies any previous mental health diagnosis. Patient states he is "really depressed" with symptoms to include feeling hopeless and isolating. It is unclear why patient is in the [place] area with patient stating he "was just looking area for a new place to live." Patient denies any legal issues and renders limited history this date. Per notes on admission Redding MD writes: Care of patient assumed from outgoing provider Arthor Captain at 3:16 PM.  See their note for further details of patient presentation and ED course. Briefly, 44 y.o. male with PMH below presents with leg pain secondary to his chronic venous stasis dermatitis, when discharge discussed, patient states that he is homeless and will kill himself by overdose when he goes home. Psych consulted the low suspicion for actual suicidally, suspect malingering. Patient is oriented and speaks in a low soft voice. Patient's memory appears to be intact and thoughts organized. Patient does not appear to be responding to internal stimuli. Case was staffed with Darcella Gasman FNP who reports patient does not meet criteria for a inpatient admission and recommended patient be seen by social work to assist with ongoing needs.    Visit Diagnosis: Adjustment disorder   Patient Reported Information How did you hear about [place]? Self   Referral name: No data  recorded  Referral phone number: No data recorded Whom do you see for routine medical problems? I don't have a doctor   Practice/Facility Name: No data recorded  Practice/Facility Phone Number: No data recorded  Name of Contact: No data recorded  Contact Number: No data recorded  Contact Fax Number: No data recorded  Prescriber Name: No data recorded  Prescriber Address (if known): No data recorded What Is the Reason for Your Visit/Call Today? Patient is S/I  How Long Has This Been Causing You Problems? 1 wk - 1 month  Have You Recently Been in Any Inpatient Treatment (Hospital/Detox/Crisis Center/28-Day Program)? No   Name/Location of Program/Hospital:No data recorded  How Long Were You There? No data recorded  When Were You Discharged? No data recorded Have You Ever Received Services From [facility] Before? No   Who Do You See at [facility]? No data recorded Have You Recently Had Any Thoughts About Hurting Yourself? Yes   Are You Planning to Commit Suicide/Harm Yourself At This time?  No  Have you Recently Had Thoughts About Hurting Someone Karolee Ohs? No   Explanation: No data recorded Have You Used Any Alcohol or Drugs in the Past 24 Hours? No   How Long Ago Did You Use Drugs or Alcohol?  No data recorded  What Did You Use and How Much? No data recorded What Do You Feel Would Help You the Most Today? Other (Comment) (UTA)  Do You Currently Have a Therapist/Psychiatrist? No   Name of Therapist/Psychiatrist: No data recorded  Have You Been Recently Discharged From Any Office Practice or Programs? No   Explanation of Discharge From Practice/Program:  No data recorded  CCA Screening Triage Referral Assessment Type of Contact: Face-to-Face   Is this Initial or Reassessment? No data recorded  Date Telepsych consult ordered in [facility]:  No data recorded  Time Telepsych consult ordered in [facility]:  No data recorded Patient Reported Information Reviewed? Yes   Patient Left  Without Being Seen? No data recorded  Reason for Not Completing Assessment: No data recorded Collateral Involvement: None  Does Patient Have a Court Appointed Legal Guardian? No data recorded  Name and Contact of Legal Guardian:  No data recorded If Minor and Not Living with Parent(s), Who has Custody? NA  Is CPS involved or ever been involved? Never  Is APS involved or ever been involved? Never  Patient Determined To Be At Risk for Harm To Self or Others Based on Review of Patient Reported Information or Presenting Complaint? Yes, for Self-Harm   Method: No data recorded  Availability of Means: No data recorded  Intent: No data recorded  Notification Required: No data recorded  Additional Information for Danger to Others Potential:  No data recorded  Additional Comments for Danger to Others Potential:  No data recorded  Are There Guns or Other Weapons in Your Home?  No data recorded   Types of Guns/Weapons: No data recorded   Are These Weapons Safely Secured?                              No data recorded   Who Could Verify You Are Able To Have These Secured:    No data recorded Do You Have any Outstanding Charges, Pending Court Dates, Parole/Probation? No data recorded Contacted To Inform of Risk of Harm To Self or Others: Other: Comment (NA)  Location of Assessment: [facility] ED  Does Patient Present under Involuntary Commitment? No   IVC Papers Initial File Date: No data recorded  [place] of Residence: No data recorded Patient Currently Receiving the Following Services: Not Receiving Services   Determination of Need: No data recorded  Options For Referral: No data recorded  Alfredia Ferguson, LCAS

## 2020-01-14 NOTE — ED Triage Notes (Signed)
Pt presenting to ED BIB [facility] Presenting with bilateral leg pain and swelling. Per Pt this has been occurring for 2 weeks. Per pt PCP said it was cellulitis and  prescribed Lasix and doxycycline but has showed no signs of improvement.

## 2020-01-15 NOTE — Discharge Instructions (Addendum)
Please follow up with your primary care provider within 5-7 days for re-evaluation of your symptoms. If you do not have a primary care provider, information for a healthcare clinic has been provided for you to make arrangements for follow up care. Please return to the emergency department for any new or worsening symptoms. ° °

## 2020-01-15 NOTE — ED Notes (Signed)
Patient Alert and oriented to baseline. Stable and ambulatory to baseline. Patient verbalized understanding of the discharge instructions.  Patient belongings were taken by the patient.   

## 2020-01-15 NOTE — TOC Initial Note (Signed)
Transition of Care [facility]) - Initial/Assessment Note    Patient Details  Name: Honor Fairbank MRN: [id number] Date of Birth: Jan 13, 1976  [facility]) CM/SW Contact:    Lockie Pares, RN Phone Number: 01/15/2020, 9:49 AM  Clinical Narrative:                 Patient cleared by psychiatry, asked by nurse to see patient for homeless resources. Patient on phone and computer when I arrived.  Gave patient listing of homeless resources and phone numbers along with bus pass. Good place to start would be [facility] on [address]. Patient getting shoes on. Told him nurse in a emergency at present would have to wait for discharge paperwork.     Barriers to Discharge: Homeless with medical needs   Patient Goals and CMS Choice        Expected Discharge Plan and Services      Homeless with resources to go to [facility] for further recommendations. To call number to see if he can get into shelters.                                          Prior Living Arrangements/Services                       Activities of Daily Living      Permission Sought/Granted                  Emotional Assessment              Admission diagnosis:  leg pain There are no problems to display for this patient.  PCP:  Patient, No Pcp Per Pharmacy:   [id number] - [place], [place] - [address] AT [facility] OF [address] [place] [address] Phone: [phone] Fax: [phone]     Social Determinants of Health (SDOH) Interventions    Readmission Risk Interventions No flowsheet data found.

## 2020-01-15 NOTE — ED Notes (Signed)
Per night shift RN, pt has been calm and cooperative overnight.

## 2020-01-17 ENCOUNTER — Emergency Department (HOSPITAL_COMMUNITY)
Admission: EM | Admit: 2020-01-17 | Discharge: 2020-01-18 | Disposition: A | Discharge status: Home / Self Care | Payer: 59 | Attending: Emergency Medicine | Admitting: Emergency Medicine

## 2020-01-17 ENCOUNTER — Encounter (HOSPITAL_COMMUNITY): Payer: Self-pay

## 2020-01-17 DIAGNOSIS — Z7901 Long term (current) use of anticoagulants: Secondary | ICD-10-CM | POA: Diagnosis not present

## 2020-01-17 DIAGNOSIS — F17200 Nicotine dependence, unspecified, uncomplicated: Secondary | ICD-10-CM | POA: Diagnosis not present

## 2020-01-17 DIAGNOSIS — Z79899 Other long term (current) drug therapy: Secondary | ICD-10-CM | POA: Diagnosis not present

## 2020-01-17 DIAGNOSIS — F419 Anxiety disorder, unspecified: Secondary | ICD-10-CM | POA: Diagnosis present

## 2020-01-17 DIAGNOSIS — R45851 Suicidal ideations: Secondary | ICD-10-CM | POA: Insufficient documentation

## 2020-01-17 DIAGNOSIS — F1593 Other stimulant use, unspecified with withdrawal: Secondary | ICD-10-CM

## 2020-01-17 DIAGNOSIS — I509 Heart failure, unspecified: Secondary | ICD-10-CM | POA: Diagnosis not present

## 2020-01-17 DIAGNOSIS — F1523 Other stimulant dependence with withdrawal: Secondary | ICD-10-CM | POA: Insufficient documentation

## 2020-01-17 LAB — COMPREHENSIVE METABOLIC PANEL
ALT: 24 U/L (ref 0–44)
AST: 21 U/L (ref 15–41)
Albumin: 3.6 g/dL (ref 3.5–5.0)
Alkaline Phosphatase: 77 U/L (ref 38–126)
Anion gap: 13 (ref 5–15)
BUN: 10 mg/dL (ref 6–20)
CO2: 27 mmol/L (ref 22–32)
Calcium: 9 mg/dL (ref 8.9–10.3)
Chloride: 105 mmol/L (ref 98–111)
Creatinine, Ser: 0.83 mg/dL (ref 0.61–1.24)
GFR calc Af Amer: >60 mL/min (ref >60)
GFR calc non Af Amer: >60 mL/min (ref >60)
Glucose, Bld: 142 mg/dL — ABNORMAL HIGH (ref 70–99)
Potassium: 3.3 mmol/L — ABNORMAL LOW (ref 3.5–5.1)
Sodium: 145 mmol/L (ref 135–145)
Total Bilirubin: 0.4 mg/dL (ref 0.3–1.2)
Total Protein: 7.5 g/dL (ref 6.5–8.1)

## 2020-01-17 LAB — CBC
HCT: 38.9 % — ABNORMAL LOW (ref 39.0–52.0)
Hemoglobin: 11.7 g/dL — ABNORMAL LOW (ref 13.0–17.0)
MCH: 27.1 pg (ref 26.0–34.0)
MCHC: 30.1 g/dL (ref 30.0–36.0)
MCV: 90.3 fL (ref 80.0–100.0)
Platelets: 213 10*3/uL (ref 150–400)
RBC: 4.31 MIL/uL (ref 4.22–5.81)
RDW: 14.8 % (ref 11.5–15.5)
WBC: 9.2 10*3/uL (ref 4.0–10.5)
nRBC: 0 % (ref 0.0–0.2)

## 2020-01-17 LAB — RAPID URINE DRUG SCREEN, HOSP PERFORMED
Amphetamines: POSITIVE — AB
Barbiturates: NOT DETECTED
Benzodiazepines: NOT DETECTED
Cocaine: NOT DETECTED
Opiates: NOT DETECTED
Tetrahydrocannabinol: NOT DETECTED

## 2020-01-17 LAB — ETHANOL: Alcohol, Ethyl (B): <10 mg/dL (ref <10)

## 2020-01-17 LAB — CBG MONITORING, ED
Glucose-Capillary: 99 mg/dL (ref 70–99)
Glucose-Capillary: 75 mg/dL (ref 70–99)
Glucose-Capillary: 94 mg/dL (ref 70–99)

## 2020-01-17 MED ORDER — APIXABAN 5 MG PO TABS
5.0000 mg | ORAL_TABLET | Freq: Two times a day (BID) | ORAL | Status: DC
  Administered 2020-01-18 (×2): 5 mg via ORAL
  Filled 2020-01-17 (×2): qty 1

## 2020-01-17 MED ORDER — ATORVASTATIN CALCIUM 40 MG PO TABS
40.0000 mg | ORAL_TABLET | Freq: Every evening | ORAL | Status: DC
  Filled 2020-01-17: qty 1

## 2020-01-17 MED ORDER — FUROSEMIDE 40 MG PO TABS
40.0000 mg | ORAL_TABLET | Freq: Two times a day (BID) | ORAL | Status: DC
  Administered 2020-01-18: 40 mg via ORAL
  Filled 2020-01-17: qty 1

## 2020-01-17 MED ORDER — ALBUTEROL SULFATE HFA 108 (90 BASE) MCG/ACT IN AERS: 2.0000 | INHALATION_SPRAY | Freq: Four times a day (QID) | RESPIRATORY_TRACT | Status: DC | PRN

## 2020-01-17 MED ORDER — CARVEDILOL 3.125 MG PO TABS
6.2500 mg | ORAL_TABLET | Freq: Two times a day (BID) | ORAL | Status: DC
  Administered 2020-01-18: 6.25 mg via ORAL
  Filled 2020-01-17: qty 2

## 2020-01-17 MED ORDER — DIVALPROEX SODIUM ER 500 MG PO TB24
500.0000 mg | ORAL_TABLET | Freq: Two times a day (BID) | ORAL | Status: DC
  Administered 2020-01-18 (×2): 500 mg via ORAL
  Filled 2020-01-17 (×2): qty 1

## 2020-01-17 NOTE — ED Notes (Signed)
Pt states that he has not eaten since last night, demanding screeners roll him to the vending machine. Pt states that his blood sugar is dangerously low. FSBG checked, 99. Pt ended up walking to vending machine.
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
## 2020-01-17 NOTE — ED Provider Notes (Signed)
[facility]-EMERGENCY DEPT [facility] Emergency Department Provider Note MRN:  [id number]  Arrival date & time: 01/17/20     Chief Complaint   Anxiety   History of Present Illness   Clayton Cameron is a 44 y.o. year-old male with a history of CHF presenting to the ED with chief complaint of anxiety.  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  Explains that he needs help quitting these substances.  Last used today.  Drank an entire case of Budweiser.  Feels very anxious and generally unwell, denies any significant pain.  Has some redness to his legs that has been present for a few months.  Denies fever.  Review of Systems  A complete 10 system review of systems was obtained and all systems are negative except as noted in the HPI and PMH.   Patient's Health History    Past Medical History:  Diagnosis Date  . CHF (congestive heart failure) (HCC)   . MRSA (methicillin resistant Staphylococcus aureus)     Past Surgical History:  Procedure Laterality Date  . CHOLECYSTECTOMY    . LAPAROSCOPIC GASTRIC BANDING    . LAPAROSCOPIC REPAIR AND REMOVAL OF GASTRIC BAND      No family history on file.  Social History   Socioeconomic History  . Marital status: Single    Spouse name: Not on file  . Number of children: Not on file  . Years of education: Not on file  . Highest education level: Not on file  Occupational History  . Not on file  Tobacco Use  . Smoking status: Current Every Day Smoker    Packs/day: 0.50  . Smokeless tobacco: Never Used  Substance and Sexual Activity  . Alcohol use: Never  . Drug use: Never  . Sexual activity: Not on file  Other Topics Concern  . Not on file  Social History Narrative  . Not on file   Social Determinants of Health   Financial Resource Strain:   . Difficulty of Paying Living Expenses: Not on file  Food Insecurity:   . Worried About Programme researcher, broadcasting/film/video in the Last Year: Not on file  . Ran Out of  Food in the Last Year: Not on file  Transportation Needs:   . Lack of Transportation (Medical): Not on file  . Lack of Transportation (Non-Medical): Not on file  Physical Activity:   . Days of Exercise per Week: Not on file  . Minutes of Exercise per Session: Not on file  Stress:   . Feeling of Stress : Not on file  Social Connections:   . Frequency of Communication with Friends and Family: Not on file  . Frequency of Social Gatherings with Friends and Family: Not on file  . Attends Religious Services: Not on file  . Active Member of Clubs or Organizations: Not on file  . Attends Banker Meetings: Not on file  . Marital Status: Not on file  Intimate Partner Violence:   . Fear of Current or Ex-Partner: Not on file  . Emotionally Abused: Not on file  . Physically Abused: Not on file  . Sexually Abused: Not on file     Physical Exam   Vitals:   01/17/20 1915 01/17/20 2258  BP: (!) 190/106 (!) 185/77  Pulse: 83 88  Resp: 20 18  Temp:    SpO2: 100% 99%    CONSTITUTIONAL: Chronically ill-appearing, NAD, obese NEURO:  Alert and oriented x 3, no focal deficits, very hard  of hearing EYES:  eyes equal and reactive ENT/NECK:  no LAD, no JVD CARDIO: Regular rate, well-perfused, normal S1 and S2 PULM:  CTAB no wheezing or rhonchi GI/GU:  normal bowel sounds, non-distended, non-tender MSK/SPINE:  No gross deformities, no edema SKIN:  no rash, atraumatic PSYCH:  Appropriate speech and behavior  *Additional and/or pertinent findings included in MDM below  Diagnostic and Interventional Summary    EKG Interpretation  Date/Time:    Ventricular Rate:    PR Interval:    QRS Duration:   QT Interval:    QTC Calculation:   R Axis:     Text Interpretation:        Labs Reviewed  COMPREHENSIVE METABOLIC PANEL - Abnormal; Notable for the following components:      Result Value   Potassium 3.3 (*)    Glucose, Bld 142 (*)    All other components within normal limits    CBC - Abnormal; Notable for the following components:   Hemoglobin 11.7 (*)    HCT 38.9 (*)    All other components within normal limits  RAPID URINE DRUG SCREEN, HOSP PERFORMED - Abnormal; Notable for the following components:   Amphetamines POSITIVE (*)    All other components within normal limits  ETHANOL  CBG MONITORING, ED  CBG MONITORING, ED  CBG MONITORING, ED    No orders to display    Medications  apixaban (ELIQUIS) tablet 5 mg (has no administration in time range)  atorvastatin (LIPITOR) tablet 40 mg (has no administration in time range)  furosemide (LASIX) tablet 40 mg (has no administration in time range)  divalproex (DEPAKOTE ER) 24 hr tablet 500 mg (has no administration in time range)  carvedilol (COREG) tablet 6.25 mg (has no administration in time range)  albuterol (VENTOLIN HFA) 108 (90 Base) MCG/ACT inhaler 2 puff (has no administration in time range)     Procedures  /  Critical Care Procedures  ED Course and Medical Decision Making  I have reviewed the triage vital signs, the nursing notes, and pertinent available records from the EMR.  Listed above are laboratory and imaging tests that I personally ordered, reviewed, and interpreted and then considered in my medical decision making (see below for details).  Patient appears mildly intoxicated, complaining of anxiety and needs help quitting drugs and alcohol.  Wants food.  Screening labs are pending, will monitor for period of time and then likely discharge with resources.     Patient is now endorsing suicidal ideation, explains that he plan to hang himself.  Medically cleared, will consult TTS for evaluation.  Signed out to provider default.  Elmer Sow. 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 Suicidal ideation  R45.851     ED Discharge Orders     None       Discharge Instructions Discussed with and Provided to Patient:   Discharge Instructions   None       Sabas Sous, MD 01/17/20 2340

## 2020-01-18 DIAGNOSIS — F419 Anxiety disorder, unspecified: Secondary | ICD-10-CM | POA: Diagnosis not present

## 2020-01-18 NOTE — ED Notes (Signed)
Pt undergoing TTS assessment now

## 2020-01-18 NOTE — ED Notes (Signed)
Pt. Dressed out in gown. Belongs are labeled and placed in cabinet.

## 2020-01-18 NOTE — ED Notes (Signed)
Assumed care of pt at this time. Pt sleeping in stretcher, NAD. Pending TTS assessment

## 2020-01-18 NOTE — ED Notes (Signed)
Marylu Lund, NP from behavioral health called, states pt is psychiatrically cleared at this time.

## 2020-01-18 NOTE — ED Notes (Signed)
Pt discharged from this ED in stable condition at this time. All discharge instructions and follow up care reviewed with pt with no further questions at this time. Pt ambulatory to baseline, clear speech.   

## 2020-01-18 NOTE — ED Notes (Signed)
Pt speaking with TTS for reeval

## 2020-01-18 NOTE — Discharge Instructions (Signed)
Substance Abuse Treatment Programs ° °Intensive Outpatient Programs °[facility]     °[address]      °[place], [place]                   °[phone]      ° °[facility] °[address] °[place], [place] °336-379-7146 ° °[facility]     °(Inpatient and outpatient)     °[address]           °[phone]   ° °[facility] °336-288-1484 (Suboxone and Methadone) ° °[address]      °[place], [place] [address]      °336-882-2125      ° °[address] °[place], [place] °852-3033 ° °[facility] (Outpatient/Inpatient, [facility])    °(insurance only) [phone]      °       °[facility] (Groups & Residential) °[place], [place] °336-389-1413 ° °   °[facility]     °[address]     °[place], [place]      °[phone]      ° °[facility] (for caregivers and family) °[address] °[place], [place] °336-299-4655 ° ° ° ° ° °Residential Treatment Programs °[facility]      °[address], [place], [place] [address]  °([phone]) [phone]      ° °[facility] °[address] [place], [place] [address] °[phone] ° °[facility]        °336-248-8914      ° °[facility] °1-800-659-3381 ° °[facility]             °[address]                                         °[place], [place]                                                °877-615-2722 or 336-784-9470                              ° °[facility] [place] °[address] °[place], [address] °1.877.941.8954 ° °[facility]    °[address]      °[place], [place]     °[phone]      ° °[facility] °[address] °[place], [place] °336-285-9073 ° °[facility]   °[address]     °[place], [place] [address]     °336-899-1550      °Admissions: 8am-3pm M-F ° °[facility] °[address] °[place],  [place] °336-227-7417 ° °[facility]: Residential Program (90 Days)   °[place], [place]      °336-725-8389 or 800-758-6077    ° °[facility] °[place], [place] °(Walk in Hours over the weekend or by referral) ° °[facility] °[address], [place], [place] [address] °(336) [phone] ° °Crisis Mobile: [facility]:  1-877-626-1772 (for crisis response 24 hours a day) °[facility] Hotline:      1-800-256-2452 °Outpatient Psychiatry and Counseling ° °[facility]: Mobile Crisis   Management 24 hours:  1-877-626-1772 ° °[facility] sliding scale fee and walk in schedule: M-F 8am-12pm/1pm-3pm °[address]  °[place], [place] [address] °336-387-6161 ° °[facility] °[address] °[place], [place] [address] °336-703-9650 ° °[facility] ([facility])- new patient walk-in appointments available Monday - Friday 8am -3pm.          °[address] °[place], [place] [address] °336-676-6840 or crisis line- 336-676-6905 ° °[facility] Intensive Outpatient Therapy Program °[address] °[place], [place] [address] °336-832-9804 ° °[facility]                  °Crisis Services      °336.641.4993      °[address]     °[place], [place] [address]                ° °[facility]   °[facility] °[phone] °[address] °[place], [place] [address] ° ° °[facility]          °[address]  °[place], [place] [address]       °([phone]) [phone] ° °[facility] °[address] °[place], [place] [address] °336-292-1510 ° °[facility]    °[address]    °[place], [place] [address]     °336-632-3505      ° °Parish McKinney, MD     °[address]     °[place] [address]     °336-282-1251     °  °[facility] °[address] °[place] [address] ° °[facility]     °[address]     °[place], [place]      °336-542-2076      ° °[facility] °Shamsher Ahluwalia, MD °[address] °[place], [place] [address] °336-420-9558 ° °[facility]     °[address]     °[place], [place] [address]     °336-274-1237      ° °[facility] °[address] °[place], [place] [address] °336-854-4450 °Resources for Temporary Residential Assistance/Crisis Centers ° °DAY CENTERS °[facility] °M-F 8am-3pm   °[address] [place], [place] [address]   336-332-0824 °Services include: laundry, barbering, support groups, case management, phone  & computer access, showers, AA/NA mtgs, mental health/substance abuse nurse, job skills class, disability information, VA assistance, spiritual classes, etc.  ° °HOMELESS SHELTERS ° °[facility]     °[facility]   °[address], [place]     °336.271.5959       °       °[facility] (women and children)       °[address] °[place], [place] [address] °336-275-0820 °[email or web address] for application and process °Application Required ° °[facility]   °[address]    °[place]  [address]     °336.886.4922       °             °[facility] °[address] °,  [address] °336.273.5572 °336-235-0363(schedule application appt.) °Application Required ° °[facility] (women only)    °[address]     °[place],  [address]     °336-884-1039      °  Intake starts 6pm daily °Need valid ID, SSC, & Police report °[facility] [place] °[address] °[place], [place] °336-881-5420 °Application Required ° °[facility] (men only)     °[address]      °[place], [place]     °[phone]      ° °[facility] °(Pregnant women only) °[address] °[place], [place] °336-275-0206 ° °[facility]      °[address]      °[place], [place] [address]     °336-722-9951      °       °[facility] °[address] °[place], [place] °336-723-1848 °90 day commitment/SA/Application process ° °[facility](men only)     °[address]     °[place], [place]     °336-748-1962       °Check-in at 7pm     °       °[facility] °[address] °[place], [place] [address] °336-248-6684 °Men/Women/Women and Children must be there by 7 pm ° °[facility] °[place], [place] °336-722-8721                ° °

## 2020-01-18 NOTE — ED Provider Notes (Signed)
Emergency Medicine Observation Re-evaluation Note  Clayton Cameron is a 44 y.o. male, seen on rounds today.  Pt initially presented to the ED for complaints of Suicidal and Addiction Problem Currently, the patient is awaiting TTS evaluation.  Physical Exam  BP (!) 148/81   Pulse 89   Temp 98.7 F (37.1 C) (Oral)   Resp 16   Ht 5\' 11"  (1.803 m)   Wt (!) 234.5 kg   SpO2 95%   BMI 72.11 kg/m  Physical Exam General: No acute distress.  Cardiac: Well perfused.  Lungs: Even, unlabored respirations.  Psych: Calm and cooperative.   ED Course / MDM  EKG:    I have reviewed the labs performed to date as well as medications administered while in observation.  Recent changes in the last 24 hours include .  Plan  Current plan is for discharge with resources. Patient is not under full IVC at this time.   , MD 01/19/20 [phone]

## 2020-01-18 NOTE — Progress Notes (Signed)
Mr. Avetisyan is a 44 year old male with reported history of bipolar disorder and polysubstance use. He presented to [facility] yesterday with reports of relapse on methadone, amphetamines and alcohol.   Patient reports he came to [facility] on August 26 from [place] due to no homeless shelter beds being available there. He originally came from [place] and is looking for an area with more resources for the homeless. He was seen in [facility] on 01/14/20 for leg pain and then reported SI. He was discharged due to appearing to be seeking inpatient treatment for secondary gain of housing.   On assessment this morning, patient denies SI/HI/AVH. He shows no signs of responding to internal stimuli. He reports relapsing on substances when he came to [place] on the 26th. He reports he is current on his Aristada injection, with next injection due in late September. He is also current with Depakote. He is interested in inpatient rehab. When peer support referral for rehab was discussed, patient asks, "Can I just leave now? I don't want to wait for that." Patient states he is receiving his disability check at noon today and wants to catch a bus to another town to try to find a place with available homeless shelter beds.  Disposition: Patient shows no evidence of acute risk of harm to self or others. He is psychiatrically cleared for discharge. ED RN updated.

## 2020-01-18 NOTE — BH Assessment (Signed)
[facility] Assessment Progress Note  Per [name], NP, this pt does not require psychiatric hospitalization at this time.  Pt is psychiatrically cleared.  Discharge instructions include referral information for area substance abuse treatment providers.  Marylu Lund reports that pt was offered a Peer Support consult, but declined this.  Pt's nurse has been notified.  Doylene Canning, MA Triage Specialist [phone]
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
## 2020-01-22 ENCOUNTER — Emergency Department (HOSPITAL_COMMUNITY)
Admission: EM | Admit: 2020-01-22 | Discharge: 2020-01-22 | Disposition: A | Discharge status: Home / Self Care | Payer: 59 | Attending: Emergency Medicine | Admitting: Emergency Medicine

## 2020-01-22 ENCOUNTER — Emergency Department (HOSPITAL_COMMUNITY): Payer: 59

## 2020-01-22 ENCOUNTER — Other Ambulatory Visit: Payer: Self-pay

## 2020-01-22 ENCOUNTER — Telehealth: Payer: Self-pay

## 2020-01-22 DIAGNOSIS — F17210 Nicotine dependence, cigarettes, uncomplicated: Secondary | ICD-10-CM | POA: Diagnosis not present

## 2020-01-22 DIAGNOSIS — I509 Heart failure, unspecified: Secondary | ICD-10-CM | POA: Insufficient documentation

## 2020-01-22 DIAGNOSIS — R0602 Shortness of breath: Secondary | ICD-10-CM | POA: Diagnosis not present

## 2020-01-22 DIAGNOSIS — Z7951 Long term (current) use of inhaled steroids: Secondary | ICD-10-CM | POA: Insufficient documentation

## 2020-01-22 DIAGNOSIS — Z760 Encounter for issue of repeat prescription: Secondary | ICD-10-CM

## 2020-01-22 LAB — CBC WITH DIFFERENTIAL/PLATELET
Abs Immature Granulocytes: 0.12 10*3/uL — ABNORMAL HIGH (ref 0.00–0.07)
Basophils Absolute: 0.1 10*3/uL (ref 0.0–0.1)
Basophils Relative: 1 %
Eosinophils Absolute: 0.2 10*3/uL (ref 0.0–0.5)
Eosinophils Relative: 2 %
HCT: 37.9 % — ABNORMAL LOW (ref 39.0–52.0)
Hemoglobin: 11.6 g/dL — ABNORMAL LOW (ref 13.0–17.0)
Immature Granulocytes: 1 %
Lymphocytes Relative: 20 %
Lymphs Abs: 1.9 10*3/uL (ref 0.7–4.0)
MCH: 27.4 pg (ref 26.0–34.0)
MCHC: 30.6 g/dL (ref 30.0–36.0)
MCV: 89.4 fL (ref 80.0–100.0)
Monocytes Absolute: 0.8 10*3/uL (ref 0.1–1.0)
Monocytes Relative: 9 %
Neutro Abs: 6 10*3/uL (ref 1.7–7.7)
Neutrophils Relative %: 67 %
Platelets: 203 10*3/uL (ref 150–400)
RBC: 4.24 MIL/uL (ref 4.22–5.81)
RDW: 14.6 % (ref 11.5–15.5)
WBC: 9.1 10*3/uL (ref 4.0–10.5)
nRBC: 0 % (ref 0.0–0.2)

## 2020-01-22 LAB — COMPREHENSIVE METABOLIC PANEL
ALT: 21 U/L (ref 0–44)
AST: 21 U/L (ref 15–41)
Albumin: 3.3 g/dL — ABNORMAL LOW (ref 3.5–5.0)
Alkaline Phosphatase: 71 U/L (ref 38–126)
Anion gap: 9 (ref 5–15)
BUN: 9 mg/dL (ref 6–20)
CO2: 29 mmol/L (ref 22–32)
Calcium: 8.1 mg/dL — ABNORMAL LOW (ref 8.9–10.3)
Chloride: 102 mmol/L (ref 98–111)
Creatinine, Ser: 0.63 mg/dL (ref 0.61–1.24)
GFR calc Af Amer: >60 mL/min (ref >60)
GFR calc non Af Amer: >60 mL/min (ref >60)
Glucose, Bld: 135 mg/dL — ABNORMAL HIGH (ref 70–99)
Potassium: 3.2 mmol/L — ABNORMAL LOW (ref 3.5–5.1)
Sodium: 140 mmol/L (ref 135–145)
Total Bilirubin: 0.3 mg/dL (ref 0.3–1.2)
Total Protein: 6.6 g/dL (ref 6.5–8.1)

## 2020-01-22 LAB — BRAIN NATRIURETIC PEPTIDE: B Natriuretic Peptide: 31.3 pg/mL (ref 0.0–100.0)

## 2020-01-22 MED ORDER — ALBUTEROL SULFATE HFA 108 (90 BASE) MCG/ACT IN AERS
6.0000 | INHALATION_SPRAY | Freq: Once | RESPIRATORY_TRACT | Status: AC
  Administered 2020-01-22: 6 via RESPIRATORY_TRACT
  Filled 2020-01-22: qty 6.7

## 2020-01-22 MED ORDER — CARVEDILOL 3.125 MG PO TABS: 3.1250 mg | ORAL_TABLET | Freq: Two times a day (BID) | ORAL | 1 refills | Status: AC

## 2020-01-22 MED ORDER — AEROCHAMBER PLUS FLO-VU MEDIUM MISC
1.0000 | Freq: Once | Status: AC
  Administered 2020-01-22: 1
  Filled 2020-01-22 (×2): qty 1

## 2020-01-22 MED ORDER — NOVOFINE 32G X 6 MM MISC: 1.0000 [IU] | Freq: Every day | 2 refills | Status: AC

## 2020-01-22 MED ORDER — APIXABAN 5 MG PO TABS
5.0000 mg | ORAL_TABLET | Freq: Once | ORAL | Status: AC
  Administered 2020-01-22: 5 mg via ORAL
  Filled 2020-01-22 (×2): qty 1

## 2020-01-22 MED ORDER — FUROSEMIDE 10 MG/ML IJ SOLN
40.0000 mg | Freq: Once | INTRAMUSCULAR | Status: AC
  Administered 2020-01-22: 40 mg via INTRAVENOUS
  Filled 2020-01-22: qty 4

## 2020-01-22 MED ORDER — LISINOPRIL 20 MG PO TABS: 20.0000 mg | ORAL_TABLET | Freq: Every day | ORAL | 1 refills | Status: AC

## 2020-01-22 MED ORDER — VICTOZA 18 MG/3ML ~~LOC~~ SOPN: 1.2000 mg | PEN_INJECTOR | Freq: Every day | SUBCUTANEOUS | 3 refills | Status: AC

## 2020-01-22 MED ORDER — ATORVASTATIN CALCIUM 20 MG PO TABS: 20.0000 mg | ORAL_TABLET | Freq: Every day | ORAL | 1 refills | Status: AC

## 2020-01-22 MED ORDER — VICTOZA 18 MG/3ML ~~LOC~~ SOPN: 1.2000 mg | PEN_INJECTOR | Freq: Every day | SUBCUTANEOUS | 3 refills | Status: DC

## 2020-01-22 MED ORDER — APIXABAN 5 MG PO TABS: 5.0000 mg | ORAL_TABLET | Freq: Two times a day (BID) | ORAL | 1 refills | Status: AC

## 2020-01-22 MED ORDER — POTASSIUM CHLORIDE ER 10 MEQ PO TBCR: 10.0000 meq | EXTENDED_RELEASE_TABLET | Freq: Every day | ORAL | 0 refills | Status: AC

## 2020-01-22 MED ORDER — IPRATROPIUM BROMIDE HFA 17 MCG/ACT IN AERS
2.0000 | INHALATION_SPRAY | Freq: Once | RESPIRATORY_TRACT | Status: AC
  Administered 2020-01-22: 2 via RESPIRATORY_TRACT
  Filled 2020-01-22: qty 12.9

## 2020-01-22 NOTE — Progress Notes (Signed)
Consult request has been received. CSW attempting to follow up at present time.  Per the EPD, pt's meds are sent to [facility] on [address] where the pt will need transport to, to pick up his meds and then transport after to [facility] where pt is expected to return.  CSW consulted with the RN CM who confirmed pt not eligible for MATCH, but pt states his co-pay is zero for the rest of the year and this is not an issue.  Pt states he needs transportation assistance as his walker, which is needed for a bus ride, is a [facility] where the pt needs transport back to after picking up his meds at [facility].  CSW will continue to follow for D/C needs.  Dorothe Pea. Rocklyn Mayberry  MSW, LCSW, LCAS, CSI Transitions of Care Clinical Social Worker Care Coordination Department Ph: [phone]

## 2020-01-22 NOTE — ED Notes (Signed)
Pt ambulatory to wheelchair and taken to front lobby exit at this time. Pt ambulatory to bench outside of ER lobby and instructed to await taxi that is en route to this facility for transport.

## 2020-01-22 NOTE — ED Triage Notes (Signed)
Pt from homeless sheilter with c/o SOB onset after smoking cigarette. Currently sleeping with no distress noted  And last SPO2 sat 96% room air skin P/W/D

## 2020-01-22 NOTE — Progress Notes (Addendum)
CSW received a call from pt's RN that pt is ready for D/C. And that they will put pt in front of ED entrance to await transport.  CSW will continue to follow for D/C needs.  CSW called [facility] who stated they cannot transport the pt to a [facility] that it must be a facility.  CSW to provide taxi vouchers due to pt's size coupled with his SOB and the EDP's recommendations that pt's size be considered when choosing transport.   12:57 PM Vouchers provided to the pt who was agreeable to take a taxi to [facility] to pick up his meds and then take a taxi from [facility] to [facility] where pt resides.  P was appreciative and thanked the CSW.  RN updated.  Dorothe Pea. Nela Bascom  MSW, LCSW, LCAS, CCS Transitions of Care Clinical Social Worker Care Coordination Department Ph: [phone]

## 2020-01-22 NOTE — ED Provider Notes (Signed)
[facility]-EMERGENCY DEPT Provider Note  CSN: [id number] Arrival date & time: 01/22/20 3557  Chief Complaint(s) Shortness of Breath  HPI Clayton Cameron is a 44 y.o. male with a history of CHF long-term smoker who presents to the emergency department with gradually worsening dyspnea on exertion. He reports that his shortness of breath got really bad earlier tonight while he was smoking a cigarette. He called out EMS. When they arrived, he was not in any respiratory distress. No medicines were required transport. Patient reports that he has been out of his home medications for the past 15 days. Reports mild cough, increased edema. Denies any other physical complaints.  HPI  Past Medical History Past Medical History:  Diagnosis Date  . CHF (congestive heart failure) (HCC)   . MRSA (methicillin resistant Staphylococcus aureus)    There are no problems to display for this patient.  Home Medication(s) Prior to Admission medications   Medication Sig Start Date End Date Taking? Authorizing Provider  albuterol (VENTOLIN HFA) 108 (90 Base) MCG/ACT inhaler Inhale 2 puffs into the lungs every 6 (six) hours as needed for wheezing or shortness of breath.    [provider]  apixaban (ELIQUIS) 5 MG TABS tablet Take 5 mg by mouth 2 (two) times daily.    [provider]  atorvastatin (LIPITOR) 40 MG tablet Take 40 mg by mouth every evening.    [provider]  carvedilol (COREG) 6.25 MG tablet Take 6.25 mg by mouth 2 (two) times daily with a meal.    [provider]  divalproex (DEPAKOTE ER) 500 MG 24 hr tablet Take 500 mg by mouth 2 (two) times daily.    [provider]  doxycycline (VIBRA-TABS) 100 MG tablet Take 100 mg by mouth 2 (two) times daily. Patient not taking: Reported on 01/17/2020    [provider]  furosemide (LASIX) 40 MG tablet Take 40 mg by mouth 2 (two) times daily.    [provider]                                                                                                                                     Past Surgical History Past Surgical History:  Procedure Laterality Date  . CHOLECYSTECTOMY    . LAPAROSCOPIC GASTRIC BANDING    . LAPAROSCOPIC REPAIR AND REMOVAL OF GASTRIC BAND     Family History No family history on file.  Social History Social History   Tobacco Use  . Smoking status: Current Every Day Smoker    Packs/day: 0.50  . Smokeless tobacco: Never Used  Substance Use Topics  . Alcohol use: Never  . Drug use: Never   Allergies Augmentin [amoxicillin-pot clavulanate], Chantix [varenicline], Codeine, Geodon [ziprasidone], Haldol [haloperidol], Morphine and related, and Oxycodone  Review of Systems Review of Systems All other systems are reviewed and are negative for acute change except as noted in the HPI  Physical  Exam Vital Signs  I have reviewed the triage vital signs BP (!) 139/93 (BP Location: Right Arm)   Pulse 89   Temp 97.9 F (36.6 C) (Oral)   Resp (!) 21   Ht 5\' 11"  (1.803 m)   Wt (!) 213.2 kg   SpO2 98%   BMI 65.55 kg/m   Physical Exam Vitals reviewed.  Constitutional:      General: He is not in acute distress.    Appearance: He is well-developed. He is morbidly obese. He is not diaphoretic.  HENT:     Head: Normocephalic and atraumatic.     Nose: Nose normal.  Eyes:     General: No scleral icterus.       Right eye: No discharge.        Left eye: No discharge.     Conjunctiva/sclera: Conjunctivae normal.     Pupils: Pupils are equal, round, and reactive to light.  Cardiovascular:     Rate and Rhythm: Normal rate and regular rhythm.     Heart sounds: No murmur heard.  No friction rub. No gallop.   Pulmonary:     Effort: Pulmonary effort is normal. Tachypnea present. No respiratory distress.     Breath sounds: Decreased air movement present. No stridor. No wheezing, rhonchi or rales.  Abdominal:     General: There is no  distension.     Palpations: Abdomen is soft.     Tenderness: There is no abdominal tenderness.  Musculoskeletal:        General: No tenderness.     Cervical back: Normal range of motion and neck supple.     Right lower leg: 1+ Pitting Edema present.     Left lower leg: 1+ Pitting Edema present.  Skin:    General: Skin is warm and dry.     Findings: Rash present. No erythema.     Comments: Chronic skin changes to BLE.  Stage II ulceration to LLE  Neurological:     Mental Status: He is alert and oriented to person, place, and time.     ED Results and Treatments Labs (all labs ordered are listed, but only abnormal results are displayed) Labs Reviewed  CBC WITH DIFFERENTIAL/PLATELET - Abnormal; Notable for the following components:      Result Value   Hemoglobin 11.6 (*)    HCT 37.9 (*)    Abs Immature Granulocytes 0.12 (*)    All other components within normal limits  COMPREHENSIVE METABOLIC PANEL - Abnormal; Notable for the following components:   Potassium 3.2 (*)    Glucose, Bld 135 (*)    Calcium 8.1 (*)    Albumin 3.3 (*)    All other components within normal limits  BRAIN NATRIURETIC PEPTIDE                                                                                                                         EKG  EKG Interpretation  Date/Time:  Saturday January 22 2020 05:46:10 EDT Ventricular Rate:  89 PR Interval:    QRS Duration: 103 QT Interval:  389 QTC Calculation: 474 R Axis:   55 Text Interpretation: Sinus rhythm Low voltage, precordial leads NO STEMI. No old tracing to compare Confirmed by Drema Pryardama, Wania Longstreth [phone]([id number]) on 01/22/2020 6:22:23 AM      Radiology DG Chest Port 1 View  Result Date: 01/22/2020 CLINICAL DATA:  Shortness of breath EXAM: PORTABLE CHEST 1 VIEW COMPARISON:  None. FINDINGS: Cardiomegaly with pulmonary vascular congestion. No frank interstitial edema. No pleural effusion or pneumothorax. IMPRESSION: Cardiomegaly with pulmonary vascular  congestion. No frank interstitial edema. Electronically Signed   By: Charline BillsSriyesh  Krishnan M.D.   On: 01/22/2020 06:43    Pertinent labs & imaging results that were available during my care of the patient were reviewed by me and considered in my medical decision making (see chart for details).  Medications Ordered in ED Medications  AeroChamber Plus Flo-Vu Medium MISC 1 each (has no administration in time range)  furosemide (LASIX) injection 40 mg (has no administration in time range)  albuterol (VENTOLIN HFA) 108 (90 Base) MCG/ACT inhaler 6 puff (6 puffs Inhalation Given 01/22/20 0614)  ipratropium (ATROVENT HFA) inhaler 2 puff (2 puffs Inhalation Given 01/22/20 [id number])                                                                                                                                    Procedures Procedures  (including critical care time)  Medical Decision Making / ED Course I have reviewed the nursing notes for this encounter and the patient's prior records (if available in EHR or on provided paperwork).   Clayton PilgrimJoshua Varone was evaluated in Emergency Department on 01/22/2020 for the symptoms described in the history of present illness. He was evaluated in the context of the global COVID-19 pandemic, which necessitated consideration that the patient might be at risk for infection with the SARS-CoV-2 virus that causes COVID-19. Institutional protocols and algorithms that pertain to the evaluation of patients at risk for COVID-19 are in a state of rapid change based on information released by regulatory bodies including the CDC and federal and state organizations. These policies and algorithms were followed during the patient's care in the ED.  Patient presents to the emergency department with shortness of breath. History of CHF and long-term smoker likely undiagnosed COPD. No acute respiratory distress at this time, the patient is mildly tachypneic.  He is satting well on room air. Lung  sounds diminished possibly due to habitus, but will provide patient with albuterol inhalers reassess.  Chest x-ray notable for cardiomegaly and pulmonary vascular congestion without any overt edema.  Screening labs obtained.  CBC at baseline. Metabolic panel without evidence of renal insufficiency.  We will provide patient with an IV dose of Lasix and allow him to diuresis in the emergency department.  We will have pharmacy verify patient's medications.  Anticipate discharge following diuresis.  Would like to provide patient with prescription for his medications once pharmacy has verified.      Final Clinical Impression(s) / ED Diagnoses Final diagnoses:  SOB (shortness of breath)      This chart was dictated using voice recognition software.  Despite best efforts to proofread,  errors can occur which can change the documentation meaning.   Nira Conn, MD 01/22/20 [phone]

## 2020-01-22 NOTE — Discharge Instructions (Signed)
1.  To get established with a primary care provider, you may call or go to the [facility] community [facility] wellness center.  I have also included a list of low cost, community clinics that you may choose to contact.  Get established as soon as possible for ongoing care of your chronic medical conditions. 2.  Return to the emergency department if you get chest pain, worsening shortness of breath, fever or other concerning symptoms.

## 2020-01-22 NOTE — Care Management (Signed)
Consult placed for CM for medication assistance. This patient has insurance, is ineligible for [facility] program.

## 2020-01-22 NOTE — ED Provider Notes (Signed)
Patient feels improved and is not having any chest pain or hypoxia.  He has put out a large volume of urine.  Patient reports he needs refills on his medications.  Due to this holiday weekend, those refills have been sent to [facility] [address].  [facility].  At this time, due to transportation difficulties social work is consulted to help patient get to the pharmacy and get his medications.  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  Will give a dose today and patient should restart after he gets medications today.  At this time, no pleuritic chest pain and shortness of breath improved with diuresis.  Do not currently see indication for PE study.  Patient is counseled on return precautions.  Discharged in good condition. Physical Exam  BP (!) 144/94   Pulse 84   Temp 97.9 F (36.6 C) (Oral)   Resp (!) 23   Ht 5\' 11"  (1.803 m)   Wt (!) 213.2 kg   SpO2 98%   BMI 65.55 kg/m   Physical Exam Alert and nontoxic.  Sitting at edge of the bed.  Patient does have morbid obesity.  Heart regular.  Venous stasis bilateral symmetric. ED Course/Procedures     Procedures  MDM  Prescription refills ordered.  Resources for health care provided.  Return precautions reviewed.       , MD 01/22/20 [phone]

## 2020-01-23 ENCOUNTER — Encounter (HOSPITAL_COMMUNITY): Payer: Self-pay

## 2020-01-23 ENCOUNTER — Emergency Department (HOSPITAL_COMMUNITY): Payer: 59

## 2020-01-23 ENCOUNTER — Emergency Department (HOSPITAL_COMMUNITY)
Admission: EM | Admit: 2020-01-23 | Discharge: 2020-01-23 | Disposition: A | Discharge status: Home / Self Care | Payer: 59 | Attending: Emergency Medicine | Admitting: Emergency Medicine

## 2020-01-23 DIAGNOSIS — R0989 Other specified symptoms and signs involving the circulatory and respiratory systems: Secondary | ICD-10-CM | POA: Diagnosis not present

## 2020-01-23 DIAGNOSIS — Z79899 Other long term (current) drug therapy: Secondary | ICD-10-CM | POA: Insufficient documentation

## 2020-01-23 DIAGNOSIS — R600 Localized edema: Secondary | ICD-10-CM | POA: Insufficient documentation

## 2020-01-23 DIAGNOSIS — R0602 Shortness of breath: Secondary | ICD-10-CM | POA: Diagnosis not present

## 2020-01-23 DIAGNOSIS — F17200 Nicotine dependence, unspecified, uncomplicated: Secondary | ICD-10-CM | POA: Diagnosis not present

## 2020-01-23 DIAGNOSIS — R5383 Other fatigue: Secondary | ICD-10-CM | POA: Insufficient documentation

## 2020-01-23 DIAGNOSIS — Z7901 Long term (current) use of anticoagulants: Secondary | ICD-10-CM | POA: Insufficient documentation

## 2020-01-23 DIAGNOSIS — I509 Heart failure, unspecified: Secondary | ICD-10-CM | POA: Insufficient documentation

## 2020-01-23 DIAGNOSIS — R609 Edema, unspecified: Secondary | ICD-10-CM

## 2020-01-23 LAB — CBC
HCT: 38.6 % — ABNORMAL LOW (ref 39.0–52.0)
Hemoglobin: 11.6 g/dL — ABNORMAL LOW (ref 13.0–17.0)
MCH: 27.4 pg (ref 26.0–34.0)
MCHC: 30.1 g/dL (ref 30.0–36.0)
MCV: 91.3 fL (ref 80.0–100.0)
Platelets: 215 10*3/uL (ref 150–400)
RBC: 4.23 MIL/uL (ref 4.22–5.81)
RDW: 14.9 % (ref 11.5–15.5)
WBC: 8.7 10*3/uL (ref 4.0–10.5)
nRBC: 0 % (ref 0.0–0.2)

## 2020-01-23 LAB — BASIC METABOLIC PANEL
Anion gap: 11 (ref 5–15)
BUN: 13 mg/dL (ref 6–20)
CO2: 28 mmol/L (ref 22–32)
Calcium: 8.6 mg/dL — ABNORMAL LOW (ref 8.9–10.3)
Chloride: 104 mmol/L (ref 98–111)
Creatinine, Ser: 0.71 mg/dL (ref 0.61–1.24)
GFR calc Af Amer: >60 mL/min (ref >60)
GFR calc non Af Amer: >60 mL/min (ref >60)
Glucose, Bld: 94 mg/dL (ref 70–99)
Potassium: 3.7 mmol/L (ref 3.5–5.1)
Sodium: 143 mmol/L (ref 135–145)

## 2020-01-23 LAB — TROPONIN I (HIGH SENSITIVITY): Troponin I (High Sensitivity): 7 ng/L (ref <18)

## 2020-01-23 MED ORDER — FUROSEMIDE 10 MG/ML IJ SOLN
80.0000 mg | Freq: Once | INTRAMUSCULAR | Status: AC
  Administered 2020-01-23: 80 mg via INTRAVENOUS
  Filled 2020-01-23: qty 8

## 2020-01-23 NOTE — ED Triage Notes (Signed)
Pt reports bilateral lower extremity pain. Patient endorses everything asked of him including chest pain and SOB. He also reports that he feels like he is "hot to touch," but is afebrile.  A&Ox4.

## 2020-01-23 NOTE — Discharge Instructions (Addendum)
Take your medications as prescribed.  Try to keep your legs elevated as much as possible.  Follow-up with your primary care doctor.  Return here as needed for any worsening symptoms.

## 2020-01-23 NOTE — ED Notes (Signed)
I called patient name to take back to a room and no one responded. 

## 2020-01-23 NOTE — ED Notes (Signed)
Pt discharged from this ED in stable condition at this time. All discharge instructions and follow up care reviewed with pt with no further questions at this time. Pt ambulatory to baseline, clear speech.   

## 2020-01-23 NOTE — Progress Notes (Signed)
CSW provided ED secretary with cab vouchers for patient to go to [facility] on [address], then to [facility].  Edwin Dada, MSW, LCSW-A Transitions of Care   Clinical Social Worker  [facility] Emergency Departments   Medical ICU [phone]

## 2020-01-23 NOTE — ED Notes (Signed)
Unsuccessful attempt to draw labs x2 

## 2020-01-23 NOTE — ED Provider Notes (Signed)
[facility]-EMERGENCY DEPT Provider Note   CSN: [id number] Arrival date & time: 01/23/20  0030     History No chief complaint on file.   Clayton Cameron is a 44 y.o. male.  Patient is a 44 year old male with a history of obesity, diabetes, hypertension, CHF who presents with leg swelling and shortness of breath.  He was actually seen here yesterday.  He says he lost his medications and had been out of them for little over 2 weeks.  He had some suggestion of fluid overload and was given a dose of Lasix where he diuresed well.  He said that he was able to pick up his prescriptions yesterday except for the Victoza which the pharmacy was out of.  He says he is concerned because his legs are so swollen and he is concerned that they may be infected.  He denies any known fevers.  He still reports some shortness of breath.  No cough or URI symptoms.  No vomiting.  He is homeless and living at [facility].        Past Medical History:  Diagnosis Date  . CHF (congestive heart failure) (HCC)   . MRSA (methicillin resistant Staphylococcus aureus)     There are no problems to display for this patient.   Past Surgical History:  Procedure Laterality Date  . CHOLECYSTECTOMY    . LAPAROSCOPIC GASTRIC BANDING    . LAPAROSCOPIC REPAIR AND REMOVAL OF GASTRIC BAND         History reviewed. No pertinent family history.  Social History   Tobacco Use  . Smoking status: Current Every Day Smoker    Packs/day: 0.50  . Smokeless tobacco: Never Used  Substance Use Topics  . Alcohol use: Never  . Drug use: Never    Home Medications Prior to Admission medications   Medication Sig Start Date End Date Taking? Authorizing Provider  albuterol (VENTOLIN HFA) 108 (90 Base) MCG/ACT inhaler Inhale 2 puffs into the lungs every 6 (six) hours as needed for wheezing or shortness of breath.   Yes [provider]  apixaban (ELIQUIS) 5 MG TABS tablet Take 1 tablet (5 mg  total) by mouth 2 (two) times daily. 01/22/20  Yes Arby Barrette, MD  atorvastatin (LIPITOR) 40 MG tablet Take 40 mg by mouth every evening.   Yes [provider]  divalproex (DEPAKOTE ER) 500 MG 24 hr tablet Take 500 mg by mouth 2 (two) times daily.   Yes [provider]  furosemide (LASIX) 40 MG tablet Take 40 mg by mouth 2 (two) times daily.   Yes [provider]  atorvastatin (LIPITOR) 20 MG tablet Take 1 tablet (20 mg total) by mouth daily. Patient not taking: Reported on 01/23/2020 01/22/20   Arby Barrette, MD  carvedilol (COREG) 3.125 MG tablet Take 1 tablet (3.125 mg total) by mouth 2 (two) times daily with a meal. Patient not taking: Reported on 01/23/2020 01/22/20   Arby Barrette, MD  carvedilol (COREG) 6.25 MG tablet Take 6.25 mg by mouth 2 (two) times daily with a meal.    [provider]  doxycycline (VIBRA-TABS) 100 MG tablet Take 100 mg by mouth 2 (two) times daily. Patient not taking: Reported on 01/17/2020    [provider]  Insulin Pen Needle (NOVOFINE) 32G X 6 MM MISC 1 Units by Does not apply route daily. 01/22/20   Arby Barrette, MD  liraglutide (VICTOZA) 18 MG/3ML SOPN Inject 0.2 mLs (1.2 mg total) into the skin daily. Patient  not taking: Reported on 01/23/2020 01/22/20   Arby Barrette, MD  lisinopril (ZESTRIL) 20 MG tablet Take 1 tablet (20 mg total) by mouth daily. Patient not taking: Reported on 01/23/2020 01/22/20   Arby Barrette, MD  potassium chloride (KLOR-CON) 10 MEQ tablet Take 1 tablet (10 mEq total) by mouth daily. Patient not taking: Reported on 01/23/2020 01/22/20   Arby Barrette, MD    Allergies    Augmentin [amoxicillin-pot clavulanate], Chantix [varenicline], Codeine, Geodon [ziprasidone], Haldol [haloperidol], Morphine and related, Oxycodone, and Tyloxapol  Review of Systems   Review of Systems  Constitutional: Positive for fatigue. Negative for chills, diaphoresis and fever.  HENT: Negative for congestion, rhinorrhea  and sneezing.   Eyes: Negative.   Respiratory: Positive for shortness of breath. Negative for cough and chest tightness.   Cardiovascular: Positive for leg swelling. Negative for chest pain.  Gastrointestinal: Negative for abdominal pain, blood in stool, diarrhea, nausea and vomiting.  Genitourinary: Negative for difficulty urinating, flank pain, frequency and hematuria.  Musculoskeletal: Negative for arthralgias and back pain.  Skin: Negative for rash.  Neurological: Negative for dizziness, speech difficulty, weakness, numbness and headaches.    Physical Exam Updated Vital Signs BP (!) 184/94 (BP Location: Left Arm)   Pulse 93   Temp 98.2 F (36.8 C) (Oral)   Resp 20   Ht 5\' 11"  (1.803 m)   Wt (!) 213.2 kg   SpO2 99%   BMI 65.55 kg/m   Physical Exam Constitutional:      Appearance: He is well-developed.  HENT:     Head: Normocephalic and atraumatic.  Eyes:     Pupils: Pupils are equal, round, and reactive to light.  Cardiovascular:     Rate and Rhythm: Normal rate and regular rhythm.     Heart sounds: Normal heart sounds.  Pulmonary:     Effort: Pulmonary effort is normal. No respiratory distress.     Breath sounds: Normal breath sounds. No wheezing or rales.  Chest:     Chest wall: No tenderness.  Abdominal:     General: Bowel sounds are normal.     Palpations: Abdomen is soft.     Tenderness: There is no abdominal tenderness. There is no guarding or rebound.  Musculoskeletal:        General: Swelling present. Normal range of motion.     Cervical back: Normal range of motion and neck supple.     Right lower leg: Edema present.     Left lower leg: Edema present.     Comments: Patient with marked edema of the lower extremities bilaterally with chronic skin changes and some areas of weeping.  There is some erythema bilaterally which appears to be more consistent with chronic skin changes from his edema rather than cellulitis.  Distal pulses are difficult to palpate  given his edema although there is no suggestions of poor perfusion.  He has normal color and warmth.  Lymphadenopathy:     Cervical: No cervical adenopathy.  Skin:    General: Skin is warm and dry.     Findings: No rash.  Neurological:     Mental Status: He is alert and oriented to person, place, and time.     ED Results / Procedures / Treatments   Labs (all labs ordered are listed, but only abnormal results are displayed) Labs Reviewed  BASIC METABOLIC PANEL - Abnormal; Notable for the following components:      Result Value   Calcium 8.6 (*)    All other  components within normal limits  CBC - Abnormal; Notable for the following components:   Hemoglobin 11.6 (*)    HCT 38.6 (*)    All other components within normal limits  TROPONIN I (HIGH SENSITIVITY)    EKG EKG Interpretation  Date/Time:  Sunday January 23 2020 01:29:12 EDT Ventricular Rate:  93 PR Interval:    QRS Duration: 101 QT Interval:  388 QTC Calculation: 483 R Axis:   53 Text Interpretation: Sinus rhythm Minimal ST depression, inferior leads Borderline prolonged QT interval 12 Lead; Mason-Likar since last tracing no significant change Confirmed by Rolan Bucco [phone]) on 01/23/2020 11:00:47 AM   Radiology DG Chest 2 View  Result Date: 01/23/2020 CLINICAL DATA:  Chest pain EXAM: CHEST - 2 VIEW COMPARISON:  01/22/2020 FINDINGS: Cardiomegaly. Vascular congestion. No confluent opacities or effusions. Possible early interstitial edema. No acute bony abnormality. IMPRESSION: Cardiomegaly with vascular congestion and possible early interstitial edema. Electronically Signed   By: Charlett Nose M.D.   On: 01/23/2020 01:26   DG Chest Port 1 View  Result Date: 01/22/2020 CLINICAL DATA:  Shortness of breath EXAM: PORTABLE CHEST 1 VIEW COMPARISON:  None. FINDINGS: Cardiomegaly with pulmonary vascular congestion. No frank interstitial edema. No pleural effusion or pneumothorax. IMPRESSION: Cardiomegaly with pulmonary  vascular congestion. No frank interstitial edema. Electronically Signed   By: Charline Bills M.D.   On: 01/22/2020 06:43    Procedures Procedures (including critical care time)  Medications Ordered in ED Medications  furosemide (LASIX) injection 80 mg (80 mg Intravenous Given 01/23/20 1122)    ED Course  I have reviewed the triage vital signs and the nursing notes.  Pertinent labs & imaging results that were available during my care of the patient were reviewed by me and considered in my medical decision making (see chart for details).    MDM Rules/Calculators/A&P                          Patient is a 44 year old male who presents with leg swelling and some mild shortness of breath.  He has been out of his medications for 2 weeks.  He was here yesterday and got diuresed and was feeling better.  He said he does have his medications now other than the Victoza which he actually got a call from the pharmacy that is ready now.  His chest x-ray shows some vascular congestion.  His labs are nonconcerning.  His leg changes appear to be more chronic in nature.  He has got erythema and weeping skin bilaterally which appears to be more from the edema.  It seems less likely to be cellulitis.  He was given Lasix in the ED and is diuresed quite a bit.  He is feeling better.  He has no current shortness of breath or increased work of breathing.  No hypoxia.  He is talking in full sentences.  He was encouraged to maintain a low-salt diet and try to keep his legs elevated.  He was encouraged to take his medications regularly and follow-up with his PCP.  He is to call make an appointment this week.  Return precautions were given.   Final Clinical Impression(s) / ED Diagnoses Final diagnoses:  Peripheral edema  Pulmonary vascular congestion    Rx / DC Orders ED Discharge Orders    None       Rolan Bucco, MD 01/23/20 1409

## 2020-01-23 NOTE — Progress Notes (Addendum)
TOC CM did confirm with [facility] that his Rx are ready, the Eliquis and Victoza. CSW working on arranging transportation to pharmacy and then to [facility]. Isidoro Donning RN CCM, [facility] ED TOC CM [phone]

## 2020-01-24 ENCOUNTER — Other Ambulatory Visit: Payer: Self-pay

## 2020-01-24 ENCOUNTER — Emergency Department (HOSPITAL_COMMUNITY)
Admission: EM | Admit: 2020-01-24 | Discharge: 2020-01-24 | Disposition: A | Discharge status: Home / Self Care | Payer: 59 | Attending: Emergency Medicine | Admitting: Emergency Medicine

## 2020-01-24 ENCOUNTER — Emergency Department (HOSPITAL_COMMUNITY): Payer: 59

## 2020-01-24 ENCOUNTER — Encounter (HOSPITAL_COMMUNITY): Payer: Self-pay | Admitting: Emergency Medicine

## 2020-01-24 ENCOUNTER — Encounter (HOSPITAL_COMMUNITY): Payer: Self-pay

## 2020-01-24 ENCOUNTER — Emergency Department (HOSPITAL_COMMUNITY)
Admission: EM | Admit: 2020-01-24 | Discharge: 2020-01-24 | Disposition: A | Discharge status: Home / Self Care | Payer: 59 | Source: Home / Self Care | Attending: Emergency Medicine | Admitting: Emergency Medicine

## 2020-01-24 DIAGNOSIS — R0602 Shortness of breath: Secondary | ICD-10-CM | POA: Diagnosis present

## 2020-01-24 DIAGNOSIS — I509 Heart failure, unspecified: Secondary | ICD-10-CM | POA: Insufficient documentation

## 2020-01-24 DIAGNOSIS — Z794 Long term (current) use of insulin: Secondary | ICD-10-CM | POA: Insufficient documentation

## 2020-01-24 DIAGNOSIS — F17200 Nicotine dependence, unspecified, uncomplicated: Secondary | ICD-10-CM | POA: Insufficient documentation

## 2020-01-24 DIAGNOSIS — I872 Venous insufficiency (chronic) (peripheral): Secondary | ICD-10-CM

## 2020-01-24 DIAGNOSIS — R062 Wheezing: Secondary | ICD-10-CM | POA: Insufficient documentation

## 2020-01-24 DIAGNOSIS — Z79899 Other long term (current) drug therapy: Secondary | ICD-10-CM | POA: Insufficient documentation

## 2020-01-24 DIAGNOSIS — R600 Localized edema: Secondary | ICD-10-CM | POA: Insufficient documentation

## 2020-01-24 DIAGNOSIS — R079 Chest pain, unspecified: Secondary | ICD-10-CM | POA: Insufficient documentation

## 2020-01-24 LAB — CBC WITH DIFFERENTIAL/PLATELET
Abs Immature Granulocytes: 0.06 10*3/uL (ref 0.00–0.07)
Basophils Absolute: 0 10*3/uL (ref 0.0–0.1)
Basophils Relative: 0 %
Eosinophils Absolute: 0.2 10*3/uL (ref 0.0–0.5)
Eosinophils Relative: 2 %
HCT: 38.4 % — ABNORMAL LOW (ref 39.0–52.0)
Hemoglobin: 11.4 g/dL — ABNORMAL LOW (ref 13.0–17.0)
Immature Granulocytes: 1 %
Lymphocytes Relative: 22 %
Lymphs Abs: 2.2 10*3/uL (ref 0.7–4.0)
MCH: 26.8 pg (ref 26.0–34.0)
MCHC: 29.7 g/dL — ABNORMAL LOW (ref 30.0–36.0)
MCV: 90.4 fL (ref 80.0–100.0)
Monocytes Absolute: 1 10*3/uL (ref 0.1–1.0)
Monocytes Relative: 10 %
Neutro Abs: 6.3 10*3/uL (ref 1.7–7.7)
Neutrophils Relative %: 65 %
Platelets: 219 10*3/uL (ref 150–400)
RBC: 4.25 MIL/uL (ref 4.22–5.81)
RDW: 15 % (ref 11.5–15.5)
WBC: 9.8 10*3/uL (ref 4.0–10.5)
nRBC: 0 % (ref 0.0–0.2)

## 2020-01-24 LAB — COMPREHENSIVE METABOLIC PANEL
ALT: 20 U/L (ref 0–44)
AST: 20 U/L (ref 15–41)
Albumin: 3.6 g/dL (ref 3.5–5.0)
Alkaline Phosphatase: 76 U/L (ref 38–126)
Anion gap: 9 (ref 5–15)
BUN: 15 mg/dL (ref 6–20)
CO2: 29 mmol/L (ref 22–32)
Calcium: 8.7 mg/dL — ABNORMAL LOW (ref 8.9–10.3)
Chloride: 102 mmol/L (ref 98–111)
Creatinine, Ser: 0.7 mg/dL (ref 0.61–1.24)
GFR calc Af Amer: >60 mL/min (ref >60)
GFR calc non Af Amer: >60 mL/min (ref >60)
Glucose, Bld: 96 mg/dL (ref 70–99)
Potassium: 3.2 mmol/L — ABNORMAL LOW (ref 3.5–5.1)
Sodium: 140 mmol/L (ref 135–145)
Total Bilirubin: 0.3 mg/dL (ref 0.3–1.2)
Total Protein: 7.3 g/dL (ref 6.5–8.1)

## 2020-01-24 LAB — BRAIN NATRIURETIC PEPTIDE: B Natriuretic Peptide: 23.6 pg/mL (ref 0.0–100.0)

## 2020-01-24 MED ORDER — TRIAMCINOLONE ACETONIDE 0.5 % EX OINT: 1.0000 "application " | TOPICAL_OINTMENT | Freq: Two times a day (BID) | CUTANEOUS | 0 refills | Status: AC

## 2020-01-24 NOTE — Discharge Instructions (Addendum)
You are seen in the ER for leg swelling.  We suspect that you are also having stasis dermatitis causing the redness and discomfort, blistering.  Apply the ointment for the next 7 days twice a day.  Continue take your Lasix.  Follow-up with [facility] or your primary care doctor for optimal management.  Return to the ER if you start having any fevers, chills, worsening redness or pain.

## 2020-01-24 NOTE — ED Notes (Signed)
Applied XXL ted to left leg

## 2020-01-24 NOTE — ED Provider Notes (Signed)
Patient seen/examined in the Emergency Department in conjunction with Midlevel Provider McDonald Patient reports shortness of breath, similar to previous ER visit Exam : On arrival to room patient is sleeping in no distress.  Room air pulse ox is 97%.  Lung exam is limited but overall clear and no signs of respiratory distress. Plan: Patient is safe for discharge and continue outpatient medications.   Zadie Rhine, MD 01/24/20 [phone]
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
## 2020-01-24 NOTE — ED Triage Notes (Signed)
Arrives via EMS from [facility]. C/C bilateral edema in LLE, endorses SOB. Concerned that his legs are infected because they are draining. EMS reports clear lung sounds, 96% RA O2 levels.

## 2020-01-24 NOTE — ED Provider Notes (Signed)
[facility]-EMERGENCY DEPT Provider Note   CSN: [id number] Arrival date & time: 01/24/20  1755     History Chief Complaint  Patient presents with  . Leg Swelling    An Schnabel is a 44 y.o. male.  HPI     44 year old male comes in a chief complaint of leg swelling.  He has history of CHF and reports prior history of cellulitis in his legs.  He comes to the ER with persistent swelling and pain in his legs.  He reports that he has had pain in his legs along with swelling for the last several days.  He has been taking Lasix.  He thinks he needs to be diuresed further.  He denies any nausea, vomiting, fevers, chills.  He has been seen in the ER multiple times recently, he reports compliance with his medications.  Past Medical History:  Diagnosis Date  . CHF (congestive heart failure) (HCC)   . MRSA (methicillin resistant Staphylococcus aureus)     There are no problems to display for this patient.   Past Surgical History:  Procedure Laterality Date  . CHOLECYSTECTOMY    . LAPAROSCOPIC GASTRIC BANDING    . LAPAROSCOPIC REPAIR AND REMOVAL OF GASTRIC BAND         No family history on file.  Social History   Tobacco Use  . Smoking status: Current Every Day Smoker    Packs/day: 1.00  . Smokeless tobacco: Never Used  Vaping Use  . Vaping Use: Never used  Substance Use Topics  . Alcohol use: Never  . Drug use: Never    Home Medications Prior to Admission medications   Medication Sig Start Date End Date Taking? Authorizing Provider  albuterol (VENTOLIN HFA) 108 (90 Base) MCG/ACT inhaler Inhale 2 puffs into the lungs every 6 (six) hours as needed for wheezing or shortness of breath.    [provider]  apixaban (ELIQUIS) 5 MG TABS tablet Take 1 tablet (5 mg total) by mouth 2 (two) times daily. 01/22/20   Arby Barrette, MD  atorvastatin (LIPITOR) 20 MG tablet Take 1 tablet (20 mg total) by mouth daily. Patient not taking: Reported on  01/23/2020 01/22/20   Arby Barrette, MD  atorvastatin (LIPITOR) 40 MG tablet Take 40 mg by mouth every evening.    [provider]  carvedilol (COREG) 3.125 MG tablet Take 1 tablet (3.125 mg total) by mouth 2 (two) times daily with a meal. Patient not taking: Reported on 01/23/2020 01/22/20   Arby Barrette, MD  carvedilol (COREG) 6.25 MG tablet Take 6.25 mg by mouth 2 (two) times daily with a meal.    [provider]  divalproex (DEPAKOTE ER) 500 MG 24 hr tablet Take 500 mg by mouth 2 (two) times daily.    [provider]  doxycycline (VIBRA-TABS) 100 MG tablet Take 100 mg by mouth 2 (two) times daily. Patient not taking: Reported on 01/17/2020    [provider]  furosemide (LASIX) 40 MG tablet Take 40 mg by mouth 2 (two) times daily.    [provider]  Insulin Pen Needle (NOVOFINE) 32G X 6 MM MISC 1 Units by Does not apply route daily. 01/22/20   Arby Barrette, MD  liraglutide (VICTOZA) 18 MG/3ML SOPN Inject 0.2 mLs (1.2 mg total) into the skin daily. Patient not taking: Reported on 01/23/2020 01/22/20   Arby Barrette, MD  lisinopril (ZESTRIL) 20 MG tablet Take 1 tablet (20 mg total) by mouth daily. Patient not taking: Reported on  01/23/2020 01/22/20   Arby Barrette, MD  potassium chloride (KLOR-CON) 10 MEQ tablet Take 1 tablet (10 mEq total) by mouth daily. Patient not taking: Reported on 01/23/2020 01/22/20   Arby Barrette, MD  triamcinolone ointment (KENALOG) 0.5 % Apply 1 application topically 2 (two) times daily for 7 days. 01/24/20 01/31/20  Derwood Kaplan, MD    Allergies    Augmentin [amoxicillin-pot clavulanate], Chantix [varenicline], Codeine, Geodon [ziprasidone], Haldol [haloperidol], Morphine and related, Oxycodone, and Tyloxapol  Review of Systems   Review of Systems  Constitutional: Positive for activity change. Negative for fever.  Respiratory: Negative for shortness of breath.   Cardiovascular: Negative for chest pain.  Gastrointestinal:  Negative for nausea and vomiting.  Skin: Positive for rash.    Physical Exam Updated Vital Signs BP (!) 145/90   Pulse 90   Temp 98.5 F (36.9 C) (Oral)   Resp (!) 24   Ht 5\' 11"  (1.803 m)   Wt (!) 213 kg   SpO2 100%   BMI 65.49 kg/m   Physical Exam Vitals and nursing note reviewed.  Constitutional:      Appearance: He is well-developed.  HENT:     Head: Atraumatic.  Cardiovascular:     Rate and Rhythm: Normal rate.  Pulmonary:     Effort: Pulmonary effort is normal.     Breath sounds: No rales.  Musculoskeletal:     Cervical back: Neck supple.     Right lower leg: Edema present.     Left lower leg: Edema present.  Skin:    General: Skin is warm.     Findings: Erythema present.  Neurological:     Mental Status: He is alert and oriented to person, place, and time.     ED Results / Procedures / Treatments   Labs (all labs ordered are listed, but only abnormal results are displayed) Labs Reviewed  COMPREHENSIVE METABOLIC PANEL - Abnormal; Notable for the following components:      Result Value   Potassium 3.2 (*)    Calcium 8.7 (*)    All other components within normal limits  CBC WITH DIFFERENTIAL/PLATELET - Abnormal; Notable for the following components:   Hemoglobin 11.4 (*)    HCT 38.4 (*)    MCHC 29.7 (*)    All other components within normal limits  BRAIN NATRIURETIC PEPTIDE  URINALYSIS, ROUTINE W REFLEX MICROSCOPIC    EKG None  Radiology DG Chest 2 View  Result Date: 01/24/2020 CLINICAL DATA:  SOB and bilateral LE edema today; hx CHF; smoker; tech was not able to obtain better lat view. Best obtainable EXAM: CHEST - 2 VIEW COMPARISON:  01/24/2020 at 12:52 a.m. FINDINGS: Cardiac silhouette normal in size.  No mediastinal or hilar masses. Lungs are clear. No convincing pleural effusion and no pneumothorax. Skeletal structures are grossly intact. IMPRESSION: No active cardiopulmonary disease. Electronically Signed   By: 03/25/2020 M.D.   On:  01/24/2020 18:33   DG Chest 2 View  Result Date: 01/24/2020 CLINICAL DATA:  Shortness of breath EXAM: CHEST - 2 VIEW COMPARISON:  None. FINDINGS: Peribronchial thickening. Heart is normal size. No confluent airspace opacities or effusions. IMPRESSION: Bronchitic changes. Electronically Signed   By: 03/25/2020 M.D.   On: 01/24/2020 01:15   DG Chest 2 View  Result Date: 01/23/2020 CLINICAL DATA:  Chest pain EXAM: CHEST - 2 VIEW COMPARISON:  01/22/2020 FINDINGS: Cardiomegaly. Vascular congestion. No confluent opacities or effusions. Possible early interstitial edema. No acute bony abnormality. IMPRESSION: Cardiomegaly with  vascular congestion and possible early interstitial edema. Electronically Signed   By: Charlett Nose M.D.   On: 01/23/2020 01:26    Procedures Procedures (including critical care time)  Medications Ordered in ED Medications - No data to display  ED Course  I have reviewed the triage vital signs and the nursing notes.  Pertinent labs & imaging results that were available during my care of the patient were reviewed by me and considered in my medical decision making (see chart for details).    MDM Rules/Calculators/A&P                          44 year old male comes in a chief complaint of leg swelling and pain.  He has history of CHF.  He reports compliance with his medications.  No evidence of overt volume overload.  BNP is normal.  He has bilateral lower extremity edema with redness, warmth to touch and diffuse tenderness.  No crepitus.  I do not think he has cellulitis right now, I suspect that he likely has stasis dermatitis.  He reports that he is had red and painful legs for the last several days and has been seen in the ER with similar complaints over the last week.  For now we will be conservative and to start him on topical antifungal. Patient advised to return to the ER if he starts having worsening of the symptoms at which time we will consider putting him on  antibiotics if needed.  CBC is normal which is also reassuring along with normal hemodynamics.  Final Clinical Impression(s) / ED Diagnoses Final diagnoses:  Stasis dermatitis of both legs    Rx / DC Orders ED Discharge Orders         Ordered    triamcinolone ointment (KENALOG) 0.5 %  2 times daily        01/24/20 2225           Derwood Kaplan, MD 01/24/20 2327

## 2020-01-24 NOTE — ED Notes (Signed)
Pt given taxi voucher, taxi called, pt being taken back to [facility] via [place] taxi. Pt wheeled out and cab here

## 2020-01-25 ENCOUNTER — Other Ambulatory Visit: Payer: Self-pay

## 2020-01-25 ENCOUNTER — Emergency Department (HOSPITAL_COMMUNITY)
Admission: EM | Admit: 2020-01-25 | Discharge: 2020-01-25 | Disposition: A | Discharge status: Home / Self Care | Payer: 59 | Attending: Emergency Medicine | Admitting: Emergency Medicine

## 2020-01-25 ENCOUNTER — Encounter (HOSPITAL_COMMUNITY): Payer: Self-pay

## 2020-01-25 DIAGNOSIS — Z79899 Other long term (current) drug therapy: Secondary | ICD-10-CM | POA: Diagnosis not present

## 2020-01-25 DIAGNOSIS — Z7901 Long term (current) use of anticoagulants: Secondary | ICD-10-CM | POA: Diagnosis not present

## 2020-01-25 DIAGNOSIS — R45851 Suicidal ideations: Secondary | ICD-10-CM | POA: Insufficient documentation

## 2020-01-25 DIAGNOSIS — I509 Heart failure, unspecified: Secondary | ICD-10-CM | POA: Insufficient documentation

## 2020-01-25 DIAGNOSIS — Z20822 Contact with and (suspected) exposure to covid-19: Secondary | ICD-10-CM | POA: Insufficient documentation

## 2020-01-25 DIAGNOSIS — F17200 Nicotine dependence, unspecified, uncomplicated: Secondary | ICD-10-CM | POA: Diagnosis not present

## 2020-01-25 LAB — ACETAMINOPHEN LEVEL: Acetaminophen (Tylenol), Serum: <10 ug/mL — ABNORMAL LOW (ref 10–30)

## 2020-01-25 LAB — CBG MONITORING, ED: Glucose-Capillary: 95 mg/dL (ref 70–99)

## 2020-01-25 LAB — ETHANOL: Alcohol, Ethyl (B): <10 mg/dL (ref <10)

## 2020-01-25 LAB — SARS CORONAVIRUS 2 BY RT PCR (HOSPITAL ORDER, PERFORMED IN ~~LOC~~ HOSPITAL LAB): SARS Coronavirus 2: NEGATIVE

## 2020-01-25 LAB — SALICYLATE LEVEL: Salicylate Lvl: <7 mg/dL — ABNORMAL LOW (ref 7.0–30.0)

## 2020-01-25 MED ORDER — ACETAMINOPHEN 325 MG PO TABS: 650.0000 mg | ORAL_TABLET | ORAL | Status: DC | PRN

## 2020-01-25 MED ORDER — LISINOPRIL 20 MG PO TABS: 20.0000 mg | ORAL_TABLET | Freq: Every day | ORAL | Status: DC

## 2020-01-25 MED ORDER — POTASSIUM CHLORIDE CRYS ER 20 MEQ PO TBCR
40.0000 meq | EXTENDED_RELEASE_TABLET | Freq: Once | ORAL | Status: AC
  Administered 2020-01-25: 40 meq via ORAL
  Filled 2020-01-25: qty 2

## 2020-01-25 MED ORDER — FUROSEMIDE 40 MG PO TABS
40.0000 mg | ORAL_TABLET | Freq: Two times a day (BID) | ORAL | Status: DC
  Administered 2020-01-25: 40 mg via ORAL
  Filled 2020-01-25: qty 1

## 2020-01-25 MED ORDER — ALUM & MAG HYDROXIDE-SIMETH 200-200-20 MG/5ML PO SUSP: 30.0000 mL | Freq: Four times a day (QID) | ORAL | Status: DC | PRN

## 2020-01-25 MED ORDER — APIXABAN 5 MG PO TABS: 5.0000 mg | ORAL_TABLET | Freq: Two times a day (BID) | ORAL | Status: DC

## 2020-01-25 MED ORDER — LIRAGLUTIDE 18 MG/3ML ~~LOC~~ SOPN: 1.2000 mg | PEN_INJECTOR | Freq: Every day | SUBCUTANEOUS | Status: DC

## 2020-01-25 MED ORDER — ATORVASTATIN CALCIUM 40 MG PO TABS: 40.0000 mg | ORAL_TABLET | Freq: Every evening | ORAL | Status: DC

## 2020-01-25 MED ORDER — DIVALPROEX SODIUM ER 500 MG PO TB24: 500.0000 mg | ORAL_TABLET | Freq: Two times a day (BID) | ORAL | Status: DC

## 2020-01-25 MED ORDER — ALBUTEROL SULFATE HFA 108 (90 BASE) MCG/ACT IN AERS: 2.0000 | INHALATION_SPRAY | Freq: Four times a day (QID) | RESPIRATORY_TRACT | Status: DC | PRN

## 2020-01-25 NOTE — ED Notes (Signed)
Patient states he feels like his sugar is dropping.

## 2020-01-25 NOTE — [facility] Suicide Risk Assessment (Cosign Needed)
Suicide Risk Assessment  Discharge Assessment   [facility] Discharge Suicide Risk Assessment   Principal Problem: <principal problem not specified> Discharge Diagnoses: Active Problems:   * No active hospital problems. *   Total Time spent with patient: 30 minutes  Musculoskeletal: Strength & Muscle Tone: within normal limits Gait & Station: normal Patient leans: N/A  Psychiatric Specialty Exam: Review of Systems  Psychiatric/Behavioral: Negative for hallucinations, memory loss and suicidal ideas. Depression: Stable. The patient is not nervous/anxious and does not have insomnia.   All other systems reviewed and are negative.    Blood pressure (!) 142/93, pulse 77, temperature 97.9 F (36.6 C), temperature source Oral, resp. rate 16, SpO2 99 %.There is no height or weight on file to calculate BMI.  General Appearance: Casual  Eye Contact::  Good  Speech:  Clear and Coherent and Normal Rate409  Volume:  Normal  Mood:  "Fine"  Affect:  Appropriate and Congruent  Thought Process:  Coherent, Goal Directed and Descriptions of Associations: Intact  Orientation:  Full (Time, Place, and Person)  Thought Content:  WDL  Suicidal Thoughts:  No  Homicidal Thoughts:  No  Memory:  Immediate;   Good Recent;   Good  Judgement:  Intact  Insight:  Present  Psychomotor Activity:  Normal  Concentration:  Good  Recall:  Good  Fund of Knowledge:Good  Language: Good  Akathisia:  No  Handed:  Right  AIMS (if indicated):     Assets:  Communication Skills Desire for Improvement  Sleep:     Cognition: WNL  ADL's:  Intact   Mental Status Per Nursing Assessment::   On Admission:    Clayton Cameron, 44 y.o., male patient seen via tele psych by this provider, Dr. Lucianne Muss; and chart reviewed on 01/25/20.  On evaluation Clayton Cameron reports that he was "Pissed off when the nurse refused to give me a voucher to get back to [employer].  So I said that I would just pull the suicide card and would  wait here until social work came in, in the morning."  Patient states that he is not suicidal "and never was"  Patient states he only needs a ride to get back home to [employer].   During evaluation Clayton Cameron is alert/oriented x 4; calm/cooperative; and mood is congruent with affect.  He does not appear to be responding to internal/external stimuli or delusional thoughts.  Patient denies suicidal/self-harm/homicidal ideation, psychosis, and paranoia.  Patient answered question appropriately.     Demographic Factors:  Male and Low socioeconomic status  Loss Factors: Homeless  Historical Factors: Impulsivity  Risk Reduction Factors:   Religious beliefs about death  Continued Clinical Symptoms:  Medical Diagnoses and Treatments/Surgeries  Cognitive Features That Contribute To Risk:  None    Suicide Risk:  Minimal: No identifiable suicidal ideation.  Patients presenting with no risk factors but with morbid ruminations; may be classified as minimal risk based on the severity of the depressive symptoms    Plan Of Care/Follow-up recommendations:  Activity:  As tolerated Diet:  Heart healthy    Disposition:  Psychiatrically cleared No evidence of imminent risk to self or others at present.   Patient does not meet criteria for psychiatric inpatient admission. Supportive therapy provided about ongoing stressors. Discussed crisis plan, support from social network, calling 911, coming to the Emergency Department, and calling Suicide Hotline.  Clayton Ogan, NP 01/25/2020, 10:23 AM

## 2020-01-25 NOTE — ED Provider Notes (Signed)
Brief update note  Gentleman seen previously by Dr. Daun Peacock, TTS consulted.  Initially recommending overnight observation.  Patient was observed overnight, he remained calm and cooperative.  Psychiatry evaluated this morning and recommended discharge and outpatient management.  Case management social work consulted to assist with voucher for transportation.   Milagros Loll, MD 01/25/20 1059

## 2020-01-25 NOTE — Discharge Instructions (Signed)
For your behavioral health needs, you are advised to follow up with one of the following providers at your earliest opportunity:       [facility]      [address]      [place], [place] [address]      [phone]      New patients are seen at their walk-in clinic.  Walk-in hours are Monday - Friday from 8:30 am - 12:00 pm, and from 1:00 pm - 2:30 pm.  Walk-in patients are seen on a first come, first served basis, so try to arrive as early as possible for the best chance of being seen the same day.       [facility]      [address]      [place], [place] [address]      [phone]      New patients are being seen in their walk-in clinic.  Walk-in hours are Monday - Thursday from 8:00 am - 11:00 am for psychiatry, and Friday from 1:00 pm - 4:00 pm for therapy.  Walk-in patients are seen on a first come, first served basis, so try to arrive as early as possible for the best chance of being seen the same day.       [facility]      [address], [address]      [place], [place] [address]      [phone]

## 2020-01-25 NOTE — BH Assessment (Signed)
Comprehensive Clinical Assessment (CCA) Screening, Triage and Referral Note  01/25/2020 Clayton Cameron [id number]     Per EDP, "44 year old male with a history of CHF, MRSA, presents emergency department today for evaluation of suicidal ideations.  Patient has had frequent visits to the emergency department and upon being discharged yesterday he requested to have a voucher for a ride home.  Upon being told that he was not able to receive a voucher today he stated that he was suicidal and wanted to overdose on his pills."   During assessment pt provides limted information, refuses to give TTS his name and date of birth until nurse is called in to assist with assessment. Pt states that he could not hear the questions and TTS talks at a loud volume. Pt states he is currently SI with plan to overdose on his blood pressure medications, states he is tired of being in pain and also wants a ticket to get back home to shelter. Pt goes into tangents about his living situation at shelter, states its not comfortable, too crowded. Pt denies HI, AVH and SIB. Pt states that he has not slept in several days and feels very depressed and irritable, Pt reports no current provider but states he is prescribed medications. 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 Pt also admits to previous SI attempt by overdose on medications last month. Per nurse note pt has been seen more than a few times at [facility] earlier today, but has left each time for similar presentation. Pt reports no previous psych history, pt currently homeless. Pt labs and UDS currently pending, per pts last assessment pt has history of alcohol use and meth.      Disposition: Nira Conn, FNP recommends pt for overnight observation, reassess in the morning. Per NP no appropriate beds for pt at this time due to medical history.  Diagnosis: Homelessness  Visit Diagnosis:    ICD-10-CM   1. Suicidal thoughts  R45.851      Patient Reported Information How did you hear about [place]? Self   Referral name: Patient presented via [facility] staff transport.   Referral phone number: No data recorded Whom do you see for routine medical problems? Hospital ER   Practice/Facility Name: No data recorded  Practice/Facility Phone Number: No data recorded  Name of Contact: No data recorded  Contact Number: No data recorded  Contact Fax Number: No data recorded  Prescriber Name: No data recorded  Prescriber Address (if known): No data recorded What Is the Reason for Your Visit/Call Today? 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  He has passive SI, however mostly focuses on meeding a place to stay.  How Long Has This Been Causing You Problems? <Week  Have You Recently Been in Any Inpatient Treatment (Hospital/Detox/Crisis Center/28-Day Program)? No   Name/Location of Program/Hospital:No data recorded  How Long Were You There? No data recorded  When Were You Discharged? No data recorded Have You Ever Received Services From [facility] Before? Yes   Who Do You See at [facility]? No data recorded Have You Recently Had Any Thoughts About Hurting Yourself? Yes   Are You Planning to Commit Suicide/Harm Yourself At This time?  Yes  Have you Recently Had Thoughts About Hurting Someone Karolee Ohs? No   Explanation: No data recorded Have You Used Any Alcohol or Drugs in the Past 24 Hours? No   How Long Ago Did You Use Drugs or Alcohol?  No data recorded  What Did You Use and How Much? Alcohol - pt reports drinking a case of beer over the past 2 days  What Do You Feel Would Help You the Most Today? Other (Comment)  Do You Currently Have a Therapist/Psychiatrist? No   Name of Therapist/Psychiatrist: No data recorded  Have You Been Recently Discharged From Any Office Practice or Programs? No   Explanation of Discharge From Practice/Program:  No data recorded    CCA  Screening Triage Referral Assessment Type of Contact: Tele-Assessment   Is this Initial or Reassessment? Initial Assessment   Date Telepsych consult ordered in [facility]:  01/25/20   Time Telepsych consult ordered in [facility]:  0423  Patient Reported Information Reviewed? Yes   Patient Left Without Being Seen? No   Reason for Not Completing Assessment: No data recorded Collateral Involvement: none  Does Patient Have a Court Appointed Legal Guardian? No data recorded  Name and Contact of Legal Guardian:  No data recorded If Minor and Not Living with Parent(s), Who has Custody? N/A  Is CPS involved or ever been involved? Never  Is APS involved or ever been involved? Never  Patient Determined To Be At Risk for Harm To Self or Others Based on Review of Patient Reported Information or Presenting Complaint? No   Method: No data recorded  Availability of Means: No data recorded  Intent: No data recorded  Notification Required: No data recorded  Additional Information for Danger to Others Potential:  No data recorded  Additional Comments for Danger to Others Potential:  No data recorded  Are There Guns or Other Weapons in Your Home?  No data recorded   Types of Guns/Weapons: No data recorded   Are These Weapons Safely Secured?                              No data recorded   Who Could Verify You Are Able To Have These Secured:    No data recorded Do You Have any Outstanding Charges, Pending Court Dates, Parole/Probation? No data recorded Contacted To Inform of Risk of Harm To Self or Others: Other: Comment (NA)  Location of Assessment: [facility] ED  Does Patient Present under Involuntary Commitment? No   IVC Papers Initial File Date: No data recorded  [place] of Residence: [place]  Patient Currently Receiving the Following Services: Not Receiving Services   Determination of Need: Urgent (48 hours)   Options For Referral: Overnight observation  Natasha Mead, LCSWA

## 2020-01-25 NOTE — Progress Notes (Signed)
TOC CSW Consult request has been received. CSW attempting to follow up at present time.   CSW will continue to follow for dc needs for transportation.  Pt was presented with Ride Waiver, pt signed waiver, Ride Waiver was faxed to transportation, and transportation was called.  Vincenzo Stave Tarpley-Carter, MSW, LCSW-A                  [facility] ED Transitions of CareClinical Social Worker [email or web address] [phone]

## 2020-01-25 NOTE — ED Provider Notes (Signed)
[facility]-EMERGENCY DEPT Provider Note   CSN: [id number] Arrival date & time: 01/25/20  0057     History Chief Complaint  Patient presents with  . Suicidal    Clayton Cameron is a 44 y.o. male.  HPI   44 year old male with a history of CHF, MRSA, presents emergency department today for evaluation of suicidal ideations.  Patient has had frequent visits to the emergency department and upon being discharged yesterday he requested to have a voucher for a ride home.  Upon being told that he was not able to receive a voucher today he stated that he was suicidal and wanted to overdose on his pills.  Past Medical History:  Diagnosis Date  . CHF (congestive heart failure) (HCC)   . MRSA (methicillin resistant Staphylococcus aureus)     There are no problems to display for this patient.   Past Surgical History:  Procedure Laterality Date  . CHOLECYSTECTOMY    . LAPAROSCOPIC GASTRIC BANDING    . LAPAROSCOPIC REPAIR AND REMOVAL OF GASTRIC BAND         No family history on file.  Social History   Tobacco Use  . Smoking status: Current Every Day Smoker    Packs/day: 1.00  . Smokeless tobacco: Never Used  Vaping Use  . Vaping Use: Never used  Substance Use Topics  . Alcohol use: Never  . Drug use: Never    Home Medications Prior to Admission medications   Medication Sig Start Date End Date Taking? Authorizing Provider  albuterol (VENTOLIN HFA) 108 (90 Base) MCG/ACT inhaler Inhale 2 puffs into the lungs every 6 (six) hours as needed for wheezing or shortness of breath.    [provider]  apixaban (ELIQUIS) 5 MG TABS tablet Take 1 tablet (5 mg total) by mouth 2 (two) times daily. 01/22/20   Arby Barrette, MD  atorvastatin (LIPITOR) 20 MG tablet Take 1 tablet (20 mg total) by mouth daily. Patient not taking: Reported on 01/23/2020 01/22/20   Arby Barrette, MD  atorvastatin (LIPITOR) 40 MG tablet Take 40 mg by mouth every evening.    [provider]  carvedilol (COREG) 3.125 MG tablet Take 1 tablet (3.125 mg total) by mouth 2 (two) times daily with a meal. Patient not taking: Reported on 01/23/2020 01/22/20   Arby Barrette, MD  carvedilol (COREG) 6.25 MG tablet Take 6.25 mg by mouth 2 (two) times daily with a meal.    [provider]  divalproex (DEPAKOTE ER) 500 MG 24 hr tablet Take 500 mg by mouth 2 (two) times daily.    [provider]  doxycycline (VIBRA-TABS) 100 MG tablet Take 100 mg by mouth 2 (two) times daily. Patient not taking: Reported on 01/17/2020    [provider]  furosemide (LASIX) 40 MG tablet Take 40 mg by mouth 2 (two) times daily.    [provider]  Insulin Pen Needle (NOVOFINE) 32G X 6 MM MISC 1 Units by Does not apply route daily. 01/22/20   Arby Barrette, MD  liraglutide (VICTOZA) 18 MG/3ML SOPN Inject 0.2 mLs (1.2 mg total) into the skin daily. Patient not taking: Reported on 01/23/2020 01/22/20   Arby Barrette, MD  lisinopril (ZESTRIL) 20 MG tablet Take 1 tablet (20 mg total) by mouth daily. Patient not taking: Reported on 01/23/2020 01/22/20   Arby Barrette, MD  potassium chloride (KLOR-CON) 10 MEQ tablet Take 1 tablet (10 mEq total) by mouth daily. Patient not taking: Reported on 01/23/2020 01/22/20  Arby Barrette, MD  triamcinolone ointment (KENALOG) 0.5 % Apply 1 application topically 2 (two) times daily for 7 days. 01/24/20 01/31/20  Derwood Kaplan, MD    Allergies    Augmentin [amoxicillin-pot clavulanate], Chantix [varenicline], Codeine, Geodon [ziprasidone], Haldol [haloperidol], Morphine and related, Oxycodone, and Tyloxapol  Review of Systems   Review of Systems  Constitutional: Negative for fever.  HENT: Negative for congestion.   Eyes: Negative for visual disturbance.  Respiratory: Negative for shortness of breath.   Cardiovascular: Positive for leg swelling. Negative for chest pain.  Gastrointestinal: Negative for abdominal pain.  Genitourinary:  Negative for flank pain.  Musculoskeletal: Negative for back pain.  Skin: Negative for rash.  Neurological: Negative for headaches.  Psychiatric/Behavioral:       SI    Physical Exam Updated Vital Signs BP (!) 155/118 (BP Location: Left Arm)   Pulse 89   Temp 97.9 F (36.6 C) (Oral)   Resp 17   SpO2 98%   Physical Exam Vitals and nursing note reviewed.  Constitutional:      General: He is not in acute distress.    Appearance: He is well-developed.  Eyes:     Conjunctiva/sclera: Conjunctivae normal.  Cardiovascular:     Rate and Rhythm: Normal rate.  Pulmonary:     Effort: Pulmonary effort is normal.  Abdominal:     General: Abdomen is protuberant.  Skin:    General: Skin is warm and dry.  Neurological:     Mental Status: He is alert and oriented to person, place, and time.  Psychiatric:        Attention and Perception: Attention normal.        Mood and Affect: Mood normal.        Speech: Speech normal.        Thought Content: Thought content includes suicidal ideation.     ED Results / Procedures / Treatments   Labs (all labs ordered are listed, but only abnormal results are displayed) Labs Reviewed  SALICYLATE LEVEL - Abnormal; Notable for the following components:      Result Value   Salicylate Lvl <7.0 (*)    All other components within normal limits  ACETAMINOPHEN LEVEL - Abnormal; Notable for the following components:   Acetaminophen (Tylenol), Serum <10 (*)    All other components within normal limits  SARS CORONAVIRUS 2 BY RT PCR (HOSPITAL ORDER, PERFORMED IN [facility])  ETHANOL  RAPID URINE DRUG SCREEN, HOSP PERFORMED  CBG MONITORING, ED  CBG MONITORING, ED    EKG None  Radiology DG Chest 2 View  Result Date: 01/24/2020 CLINICAL DATA:  SOB and bilateral LE edema today; hx CHF; smoker; tech was not able to obtain better lat view. Best obtainable EXAM: CHEST - 2 VIEW COMPARISON:  01/24/2020 at 12:52 a.m. FINDINGS: Cardiac  silhouette normal in size.  No mediastinal or hilar masses. Lungs are clear. No convincing pleural effusion and no pneumothorax. Skeletal structures are grossly intact. IMPRESSION: No active cardiopulmonary disease. Electronically Signed   By: Amie Portland M.D.   On: 01/24/2020 18:33   DG Chest 2 View  Result Date: 01/24/2020 CLINICAL DATA:  Shortness of breath EXAM: CHEST - 2 VIEW COMPARISON:  None. FINDINGS: Peribronchial thickening. Heart is normal size. No confluent airspace opacities or effusions. IMPRESSION: Bronchitic changes. Electronically Signed   By: Charlett Nose M.D.   On: 01/24/2020 01:15    Procedures Procedures (including critical care time)  Medications Ordered in ED Medications  potassium chloride  SA (KLOR-CON) CR tablet 40 mEq (has no administration in time range)  lisinopril (ZESTRIL) tablet 20 mg (has no administration in time range)  liraglutide (VICTOZA) SOPN 1.2 mg (has no administration in time range)  divalproex (DEPAKOTE ER) 24 hr tablet 500 mg (has no administration in time range)  atorvastatin (LIPITOR) tablet 40 mg (has no administration in time range)  apixaban (ELIQUIS) tablet 5 mg (has no administration in time range)  albuterol (VENTOLIN HFA) 108 (90 Base) MCG/ACT inhaler 2 puff (has no administration in time range)  furosemide (LASIX) tablet 40 mg (has no administration in time range)    ED Course  I have reviewed the triage vital signs and the nursing notes.  Pertinent labs & imaging results that were available during my care of the patient were reviewed by me and considered in my medical decision making (see chart for details).    MDM Rules/Calculators/A&P                          44 year old male presenting for evaluation of suicidal ideations.  Seen in the emergency department yesterday for other complaints and was discharged after normal work-up.  Upon understanding that he would not be able to receive a cab voucher home he stated that he was  suicidal and wanted to take all of his pills in order to harm himself.  Labs from yesterday and today reviewed and are reassuring.  Consult to TTS placed.  BH H evaluated patient and recommends over night obs  The patient has been placed in psychiatric observation due to the need to provide a safe environment for the patient while obtaining psychiatric consultation and evaluation, as well as ongoing medical and medication management to treat the patient's condition.  The patient has not been placed under full IVC at this time.    Final Clinical Impression(s) / ED Diagnoses Final diagnoses:  Suicidal thoughts    Rx / DC Orders ED Discharge Orders    None       Rayne Du 01/25/20 [id number]    Palumbo, April, MD 01/25/20 [phone]

## 2020-01-25 NOTE — ED Triage Notes (Signed)
Patient arrived after previous discharge stating he is suicidal over the last 20 minutes due to not having a ride home. Patient reports he would take all his medication he has with him.

## 2020-01-25 NOTE — BH Assessment (Signed)
[facility] Assessment Progress Note  Per Shuvon Rankin, NP, this pt does not require psychiatric hospitalization at this time.  Pt is to be discharged from [facility] with referral information for area mental health providers.  This has been included in pt's discharge instructions.  Pt's nurse has been notified.  Doylene Canning, MA Triage Specialist [phone]

## 2020-01-25 NOTE — ED Notes (Addendum)
Patients belongings taken, including medications, and labeled. Patient wanded by security.

## 2020-01-26 ENCOUNTER — Encounter (HOSPITAL_COMMUNITY): Payer: Self-pay

## 2020-01-26 ENCOUNTER — Emergency Department (HOSPITAL_COMMUNITY)
Admission: EM | Admit: 2020-01-26 | Discharge: 2020-01-26 | Disposition: A | Discharge status: Home / Self Care | Payer: 59 | Attending: Emergency Medicine | Admitting: Emergency Medicine

## 2020-01-26 ENCOUNTER — Other Ambulatory Visit: Payer: Self-pay

## 2020-01-26 DIAGNOSIS — Z79899 Other long term (current) drug therapy: Secondary | ICD-10-CM | POA: Diagnosis not present

## 2020-01-26 DIAGNOSIS — L739 Follicular disorder, unspecified: Secondary | ICD-10-CM | POA: Diagnosis not present

## 2020-01-26 DIAGNOSIS — F17200 Nicotine dependence, unspecified, uncomplicated: Secondary | ICD-10-CM | POA: Insufficient documentation

## 2020-01-26 DIAGNOSIS — R609 Edema, unspecified: Secondary | ICD-10-CM | POA: Diagnosis not present

## 2020-01-26 DIAGNOSIS — Z7901 Long term (current) use of anticoagulants: Secondary | ICD-10-CM | POA: Diagnosis not present

## 2020-01-26 DIAGNOSIS — I509 Heart failure, unspecified: Secondary | ICD-10-CM | POA: Diagnosis not present

## 2020-01-26 LAB — COMPREHENSIVE METABOLIC PANEL
ALT: 21 U/L (ref 0–44)
AST: 21 U/L (ref 15–41)
Albumin: 3 g/dL — ABNORMAL LOW (ref 3.5–5.0)
Alkaline Phosphatase: 74 U/L (ref 38–126)
Anion gap: 11 (ref 5–15)
BUN: 10 mg/dL (ref 6–20)
CO2: 25 mmol/L (ref 22–32)
Calcium: 8.6 mg/dL — ABNORMAL LOW (ref 8.9–10.3)
Chloride: 101 mmol/L (ref 98–111)
Creatinine, Ser: 0.73 mg/dL (ref 0.61–1.24)
GFR calc Af Amer: >60 mL/min (ref >60)
GFR calc non Af Amer: >60 mL/min (ref >60)
Glucose, Bld: 120 mg/dL — ABNORMAL HIGH (ref 70–99)
Potassium: 3.4 mmol/L — ABNORMAL LOW (ref 3.5–5.1)
Sodium: 137 mmol/L (ref 135–145)
Total Bilirubin: 0.2 mg/dL — ABNORMAL LOW (ref 0.3–1.2)
Total Protein: 6.5 g/dL (ref 6.5–8.1)

## 2020-01-26 LAB — CBC WITH DIFFERENTIAL/PLATELET
Abs Immature Granulocytes: 0.1 10*3/uL — ABNORMAL HIGH (ref 0.00–0.07)
Basophils Absolute: 0 10*3/uL (ref 0.0–0.1)
Basophils Relative: 1 %
Eosinophils Absolute: 0.1 10*3/uL (ref 0.0–0.5)
Eosinophils Relative: 2 %
HCT: 37 % — ABNORMAL LOW (ref 39.0–52.0)
Hemoglobin: 10.9 g/dL — ABNORMAL LOW (ref 13.0–17.0)
Immature Granulocytes: 1 %
Lymphocytes Relative: 23 %
Lymphs Abs: 1.7 10*3/uL (ref 0.7–4.0)
MCH: 26.6 pg (ref 26.0–34.0)
MCHC: 29.5 g/dL — ABNORMAL LOW (ref 30.0–36.0)
MCV: 90.2 fL (ref 80.0–100.0)
Monocytes Absolute: 0.6 10*3/uL (ref 0.1–1.0)
Monocytes Relative: 9 %
Neutro Abs: 4.6 10*3/uL (ref 1.7–7.7)
Neutrophils Relative %: 64 %
Platelets: 213 10*3/uL (ref 150–400)
RBC: 4.1 MIL/uL — ABNORMAL LOW (ref 4.22–5.81)
RDW: 14.9 % (ref 11.5–15.5)
WBC: 7.2 10*3/uL (ref 4.0–10.5)
nRBC: 0 % (ref 0.0–0.2)

## 2020-01-26 MED ORDER — FUROSEMIDE 20 MG PO TABS: 20.0000 mg | ORAL_TABLET | Freq: Two times a day (BID) | ORAL | 0 refills | Status: AC

## 2020-01-26 MED ORDER — OXYCODONE-ACETAMINOPHEN 5-325 MG PO TABS
1.0000 | ORAL_TABLET | Freq: Once | ORAL | Status: AC
  Administered 2020-01-26: 1 via ORAL
  Filled 2020-01-26: qty 1

## 2020-01-26 MED ORDER — IBUPROFEN 400 MG PO TABS
600.0000 mg | ORAL_TABLET | Freq: Once | ORAL | Status: AC
  Administered 2020-01-26: 600 mg via ORAL
  Filled 2020-01-26: qty 1

## 2020-01-26 MED ORDER — POTASSIUM CHLORIDE CRYS ER 20 MEQ PO TBCR
60.0000 meq | EXTENDED_RELEASE_TABLET | Freq: Once | ORAL | Status: AC
  Administered 2020-01-26: 60 meq via ORAL
  Filled 2020-01-26: qty 3

## 2020-01-26 MED ORDER — CEPHALEXIN 500 MG PO CAPS: 500.0000 mg | ORAL_CAPSULE | Freq: Three times a day (TID) | ORAL | 0 refills | Status: AC

## 2020-01-26 MED ORDER — FUROSEMIDE 20 MG PO TABS
60.0000 mg | ORAL_TABLET | Freq: Once | ORAL | Status: AC
  Administered 2020-01-26: 60 mg via ORAL
  Filled 2020-01-26: qty 3

## 2020-01-26 MED ORDER — POTASSIUM CHLORIDE CRYS ER 20 MEQ PO TBCR: 20.0000 meq | EXTENDED_RELEASE_TABLET | Freq: Every day | ORAL | 0 refills | Status: AC

## 2020-01-26 MED ORDER — CEPHALEXIN 250 MG PO CAPS
500.0000 mg | ORAL_CAPSULE | Freq: Once | ORAL | Status: AC
  Administered 2020-01-26: 500 mg via ORAL
  Filled 2020-01-26: qty 2

## 2020-01-26 NOTE — Discharge Planning (Signed)
RNCM consulted in regards to medication assistance.  Pt has Rx insurance coverage and is not eligible for Medication Assistance Through [facility]) program.

## 2020-01-26 NOTE — ED Notes (Signed)
This RN went over discharge instructions with pt. All questions answered. Pt requesting cab voucher stating "I can't walk that far to the bus station and I need to get my medication from the pharmacy first".

## 2020-01-26 NOTE — ED Triage Notes (Signed)
Patient arrived by [facility] complaining of ongoing bilateral leg pain related to swelling of extremities that has been ongoing for months. Patient alert and oriented. Patient states he doesn't have MD

## 2020-01-26 NOTE — ED Notes (Signed)
Called for vitals and no response 

## 2020-01-26 NOTE — Progress Notes (Signed)
TOC CM attempted call to patient to arrange transportation to his appts and PCP follow up. Left HIPAA compliant voice message for a return call. Isidoro Donning RN CCM, [facility] ED TOC CM [phone]

## 2020-01-26 NOTE — ED Provider Notes (Signed)
[facility] EMERGENCY DEPARTMENT Provider Note   CSN: [id number] Arrival date & time: 01/26/20  4098     History No chief complaint on file.   Clayton Cameron is a 44 y.o. male.  HPI   44 year old male with multiple complaints.  Of note, patient had numerous evaluations in the emergency room over the past couple weeks.  He is presenting today primarily with pain in his lower legs.  He is concerned that he has an infection.  He was recently evaluated for the same.  He was diagnosed with stasis dermatitis.  He states that he is supposed to be on Lasix but because of transportation and financial issues he has not been taking it.  He is advised to follow-up with the [facility] but is yet to do so.  Past Medical History:  Diagnosis Date  . CHF (congestive heart failure) (HCC)   . MRSA (methicillin resistant Staphylococcus aureus)     There are no problems to display for this patient.   Past Surgical History:  Procedure Laterality Date  . CHOLECYSTECTOMY    . LAPAROSCOPIC GASTRIC BANDING    . LAPAROSCOPIC REPAIR AND REMOVAL OF GASTRIC BAND         No family history on file.  Social History   Tobacco Use  . Smoking status: Current Every Day Smoker    Packs/day: 1.00  . Smokeless tobacco: Never Used  Vaping Use  . Vaping Use: Never used  Substance Use Topics  . Alcohol use: Never  . Drug use: Never    Home Medications Prior to Admission medications   Medication Sig Start Date End Date Taking? Authorizing Provider  albuterol (VENTOLIN HFA) 108 (90 Base) MCG/ACT inhaler Inhale 2 puffs into the lungs every 6 (six) hours as needed for wheezing or shortness of breath.   Yes [provider]  apixaban (ELIQUIS) 5 MG TABS tablet Take 1 tablet (5 mg total) by mouth 2 (two) times daily. 01/22/20  Yes Arby Barrette, MD  atorvastatin (LIPITOR) 20 MG tablet Take 1 tablet (20 mg total) by mouth daily. 01/22/20  Yes Arby Barrette, MD   carvedilol (COREG) 3.125 MG tablet Take 1 tablet (3.125 mg total) by mouth 2 (two) times daily with a meal. 01/22/20  Yes Pfeiffer, Lebron Conners, MD  liraglutide (VICTOZA) 18 MG/3ML SOPN Inject 0.2 mLs (1.2 mg total) into the skin daily. 01/22/20  Yes Pfeiffer, Lebron Conners, MD  Insulin Pen Needle (NOVOFINE) 32G X 6 MM MISC 1 Units by Does not apply route daily. 01/22/20   Arby Barrette, MD  lisinopril (ZESTRIL) 20 MG tablet Take 1 tablet (20 mg total) by mouth daily. 01/22/20   Arby Barrette, MD  naproxen sodium (ALEVE) 220 MG tablet Take 440 mg by mouth every 8 (eight) hours as needed (pain).    [provider]  potassium chloride (KLOR-CON) 10 MEQ tablet Take 1 tablet (10 mEq total) by mouth daily. 01/22/20   Arby Barrette, MD  triamcinolone ointment (KENALOG) 0.5 % Apply 1 application topically 2 (two) times daily for 7 days. 01/24/20 01/31/20  Derwood Kaplan, MD    Allergies    Augmentin [amoxicillin-pot clavulanate], Chantix [varenicline], Codeine, Geodon [ziprasidone], Haldol [haloperidol], Morphine and related, Oxycodone, and Tyloxapol  Review of Systems   Review of Systems All systems reviewed and negative, other than as noted in HPI.  Physical Exam Updated Vital Signs BP (!) 160/100 (BP Location: Right Wrist)   Pulse 87   Temp 98.5 F (36.9 C) (  Oral)   Resp 16   SpO2 95%   Physical Exam Vitals and nursing note reviewed.  Constitutional:      General: He is not in acute distress.    Appearance: He is well-developed.     Comments: Laying in bed.  No acute distress.  Morbidly obese.  HENT:     Head: Normocephalic and atraumatic.  Eyes:     General:        Right eye: No discharge.        Left eye: No discharge.     Conjunctiva/sclera: Conjunctivae normal.  Cardiovascular:     Rate and Rhythm: Normal rate and regular rhythm.     Heart sounds: Normal heart sounds. No murmur heard.  No friction rub. No gallop.   Pulmonary:     Effort: Pulmonary effort is normal. No respiratory  distress.     Breath sounds: Normal breath sounds.  Abdominal:     General: There is no distension.     Palpations: Abdomen is soft.     Tenderness: There is no abdominal tenderness.  Musculoskeletal:        General: No tenderness.     Cervical back: Neck supple.  Skin:    General: Skin is warm.     Comments: Chronic appearing skin changes of bilateral shins consistent with stasis dermatitis.  Some serous drainage couple areas.  Possibly some component of superimposed cellulitis.  Also scattered folliculitis of bilateral lower legs and thighs.  Neurological:     Mental Status: He is alert.  Psychiatric:        Behavior: Behavior normal.        Thought Content: Thought content normal.     ED Results / Procedures / Treatments   Labs (all labs ordered are listed, but only abnormal results are displayed) Labs Reviewed  COMPREHENSIVE METABOLIC PANEL - Abnormal; Notable for the following components:      Result Value   Potassium 3.4 (*)    Glucose, Bld 120 (*)    Calcium 8.6 (*)    Albumin 3.0 (*)    Total Bilirubin 0.2 (*)    All other components within normal limits  CBC WITH DIFFERENTIAL/PLATELET - Abnormal; Notable for the following components:   RBC 4.10 (*)    Hemoglobin 10.9 (*)    HCT 37.0 (*)    MCHC 29.5 (*)    Abs Immature Granulocytes 0.10 (*)    All other components within normal limits    EKG None  Radiology DG Chest 2 View  Result Date: 01/24/2020 CLINICAL DATA:  SOB and bilateral LE edema today; hx CHF; smoker; tech was not able to obtain better lat view. Best obtainable EXAM: CHEST - 2 VIEW COMPARISON:  01/24/2020 at 12:52 a.m. FINDINGS: Cardiac silhouette normal in size.  No mediastinal or hilar masses. Lungs are clear. No convincing pleural effusion and no pneumothorax. Skeletal structures are grossly intact. IMPRESSION: No active cardiopulmonary disease. Electronically Signed   By: Amie Portland M.D.   On: 01/24/2020 18:33    Procedures Procedures  (including critical care time)  Medications Ordered in ED Medications  furosemide (LASIX) tablet 60 mg (60 mg Oral Given 01/26/20 1435)  potassium chloride SA (KLOR-CON) CR tablet 60 mEq (60 mEq Oral Given 01/26/20 1435)  cephALEXin (KEFLEX) capsule 500 mg (500 mg Oral Given 01/26/20 1432)  oxyCODONE-acetaminophen (PERCOCET/ROXICET) 5-325 MG per tablet 1 tablet (1 tablet Oral Given 01/26/20 1434)  ibuprofen (ADVIL) tablet 600 mg (600 mg Oral Given 01/26/20  1433)    ED Course  I have reviewed the triage vital signs and the nursing notes.  Pertinent labs & imaging results that were available during my care of the patient were reviewed by me and considered in my medical decision making (see chart for details).    MDM Rules/Calculators/A&P                          44yM with LE edema and skin changes. Likely venous stasis and also scattered areas of folliculitis to b/l legs. Will place on abx although I'm not convinced that changes to shin are infectious. Prescription for lasix. Stressed importance of outpt FU.   Final Clinical Impression(s) / ED Diagnoses Final diagnoses:  Peripheral edema  Folliculitis    Rx / DC Orders ED Discharge Orders    None       Raeford Razor, MD 01/29/20 [phone]

## 2020-01-26 NOTE — ED Notes (Signed)
CSW made aware that the pt is requesting cab voucher and stating that he needs to get to the pharmacy before going back to the shelter.

## 2020-01-27 ENCOUNTER — Encounter (HOSPITAL_COMMUNITY): Payer: Self-pay

## 2020-01-27 ENCOUNTER — Emergency Department (HOSPITAL_COMMUNITY)
Admission: EM | Admit: 2020-01-27 | Discharge: 2020-01-28 | Disposition: A | Discharge status: Home / Self Care | Payer: 59 | Attending: Emergency Medicine | Admitting: Emergency Medicine

## 2020-01-27 DIAGNOSIS — Z794 Long term (current) use of insulin: Secondary | ICD-10-CM | POA: Insufficient documentation

## 2020-01-27 DIAGNOSIS — Z5900 Homelessness unspecified: Secondary | ICD-10-CM

## 2020-01-27 DIAGNOSIS — I509 Heart failure, unspecified: Secondary | ICD-10-CM | POA: Insufficient documentation

## 2020-01-27 DIAGNOSIS — F17210 Nicotine dependence, cigarettes, uncomplicated: Secondary | ICD-10-CM | POA: Insufficient documentation

## 2020-01-27 DIAGNOSIS — F332 Major depressive disorder, recurrent severe without psychotic features: Secondary | ICD-10-CM

## 2020-01-27 DIAGNOSIS — Z7901 Long term (current) use of anticoagulants: Secondary | ICD-10-CM | POA: Insufficient documentation

## 2020-01-27 DIAGNOSIS — Z79899 Other long term (current) drug therapy: Secondary | ICD-10-CM | POA: Insufficient documentation

## 2020-01-27 DIAGNOSIS — R45851 Suicidal ideations: Secondary | ICD-10-CM

## 2020-01-27 DIAGNOSIS — Z7951 Long term (current) use of inhaled steroids: Secondary | ICD-10-CM | POA: Insufficient documentation

## 2020-01-27 LAB — BASIC METABOLIC PANEL
Anion gap: 11 (ref 5–15)
BUN: 11 mg/dL (ref 6–20)
CO2: 24 mmol/L (ref 22–32)
Calcium: 8.5 mg/dL — ABNORMAL LOW (ref 8.9–10.3)
Chloride: 104 mmol/L (ref 98–111)
Creatinine, Ser: 0.78 mg/dL (ref 0.61–1.24)
GFR calc Af Amer: >60 mL/min (ref >60)
GFR calc non Af Amer: >60 mL/min (ref >60)
Glucose, Bld: 115 mg/dL — ABNORMAL HIGH (ref 70–99)
Potassium: 4.1 mmol/L (ref 3.5–5.1)
Sodium: 139 mmol/L (ref 135–145)

## 2020-01-27 LAB — CBC
HCT: 39.1 % (ref 39.0–52.0)
Hemoglobin: 11.5 g/dL — ABNORMAL LOW (ref 13.0–17.0)
MCH: 26.9 pg (ref 26.0–34.0)
MCHC: 29.4 g/dL — ABNORMAL LOW (ref 30.0–36.0)
MCV: 91.6 fL (ref 80.0–100.0)
Platelets: 213 10*3/uL (ref 150–400)
RBC: 4.27 MIL/uL (ref 4.22–5.81)
RDW: 14.8 % (ref 11.5–15.5)
WBC: 8 10*3/uL (ref 4.0–10.5)
nRBC: 0 % (ref 0.0–0.2)

## 2020-01-27 LAB — SALICYLATE LEVEL: Salicylate Lvl: <7 mg/dL — ABNORMAL LOW (ref 7.0–30.0)

## 2020-01-27 LAB — ETHANOL: Alcohol, Ethyl (B): <10 mg/dL (ref <10)

## 2020-01-27 LAB — CBG MONITORING, ED: Glucose-Capillary: 96 mg/dL (ref 70–99)

## 2020-01-27 LAB — ACETAMINOPHEN LEVEL: Acetaminophen (Tylenol), Serum: <10 ug/mL — ABNORMAL LOW (ref 10–30)

## 2020-01-27 MED ORDER — CEPHALEXIN 250 MG PO CAPS
500.0000 mg | ORAL_CAPSULE | Freq: Once | ORAL | Status: AC
  Administered 2020-01-27: 500 mg via ORAL
  Filled 2020-01-27: qty 2

## 2020-01-27 NOTE — ED Notes (Signed)
Pt in bariatric gown due to unavailability of appropriately-sized scrubs; pt wanded by security. Belongings inventoried & placed in locker #2

## 2020-01-27 NOTE — ED Provider Notes (Signed)
[name] EMERGENCY DEPARTMENT Provider Note   CSN: [id number] Arrival date & time: 01/27/20  1331     History Chief Complaint  Patient presents with  . Cellulitis    Clayton Cameron is a 44 y.o. male.  Patient is a 44 year old male with a history of MRSA, chronic venous stasis bilaterally, CHF, homelessness, bipolar disease who recently came to live in [place] and has now visited the emergency room 6 times in the last 5 days who presents today after waiting approximately 8 hours from [facility] to have his legs evaluated.  Patient was seen yesterday for this and discharged with Keflex and recommended local wound care and following up with wound care.  Patient reports that a [employer] they said that he had to have his legs wrapped.  Patient reports he became so angry at [facility] and while waiting in the waiting room that he decided to overdose on his medication.  He is told the nurse that he overdosed on Coreg however he will not tell me a specific medication he overdosed on currently.  His medications were taken from him.  Patient just repeatedly states he is very angry and he wants to kill himself and kill someone else.  He is scattered and unable to states tell a accurate history.  There is been no report of fever and patient denies shortness of breath or cough.  He reports he is trying to be calm and calm down but he is just very angry right now  The history is provided by the patient.       Past Medical History:  Diagnosis Date  . CHF (congestive heart failure) (HCC)   . MRSA (methicillin resistant Staphylococcus aureus)     There are no problems to display for this patient.   Past Surgical History:  Procedure Laterality Date  . CHOLECYSTECTOMY    . LAPAROSCOPIC GASTRIC BANDING    . LAPAROSCOPIC REPAIR AND REMOVAL OF GASTRIC BAND         No family history on file.  Social History   Tobacco Use  . Smoking status: Current  Every Day Smoker    Packs/day: 1.00  . Smokeless tobacco: Never Used  Vaping Use  . Vaping Use: Never used  Substance Use Topics  . Alcohol use: Never  . Drug use: Never    Home Medications Prior to Admission medications   Medication Sig Start Date End Date Taking? Authorizing Provider  albuterol (VENTOLIN HFA) 108 (90 Base) MCG/ACT inhaler Inhale 2 puffs into the lungs every 6 (six) hours as needed for wheezing or shortness of breath.    [provider]  apixaban (ELIQUIS) 5 MG TABS tablet Take 1 tablet (5 mg total) by mouth 2 (two) times daily. 01/22/20   Arby Barrette, MD  atorvastatin (LIPITOR) 20 MG tablet Take 1 tablet (20 mg total) by mouth daily. 01/22/20   Arby Barrette, MD  carvedilol (COREG) 3.125 MG tablet Take 1 tablet (3.125 mg total) by mouth 2 (two) times daily with a meal. 01/22/20   Arby Barrette, MD  cephALEXin (KEFLEX) 500 MG capsule Take 1 capsule (500 mg total) by mouth 3 (three) times daily. 01/26/20   Raeford Razor, MD  furosemide (LASIX) 20 MG tablet Take 1 tablet (20 mg total) by mouth 2 (two) times daily. 01/26/20   Raeford Razor, MD  Insulin Pen Needle (NOVOFINE) 32G X 6 MM MISC 1 Units by Does not apply route daily. 01/22/20   Arby Barrette, MD  liraglutide (VICTOZA) 18 MG/3ML SOPN Inject 0.2 mLs (1.2 mg total) into the skin daily. 01/22/20   Arby Barrette, MD  lisinopril (ZESTRIL) 20 MG tablet Take 1 tablet (20 mg total) by mouth daily. 01/22/20   Arby Barrette, MD  naproxen sodium (ALEVE) 220 MG tablet Take 440 mg by mouth every 8 (eight) hours as needed (pain).    [provider]  potassium chloride (KLOR-CON) 10 MEQ tablet Take 1 tablet (10 mEq total) by mouth daily. 01/22/20   Arby Barrette, MD  potassium chloride SA (KLOR-CON) 20 MEQ tablet Take 1 tablet (20 mEq total) by mouth daily. 01/26/20   Raeford Razor, MD  triamcinolone ointment (KENALOG) 0.5 % Apply 1 application topically 2 (two) times daily for 7 days. 01/24/20 01/31/20  Derwood Kaplan, MD    Allergies    Augmentin [amoxicillin-pot clavulanate], Chantix [varenicline], Codeine, Geodon [ziprasidone], Haldol [haloperidol], Morphine and related, Oxycodone, and Tyloxapol  Review of Systems   Review of Systems  All other systems reviewed and are negative.   Physical Exam Updated Vital Signs BP 139/74   Pulse 80   Temp 99.1 F (37.3 C) (Oral)   Resp 18   Ht 5\' 11"  (1.803 m)   Wt (!) 212.7 kg   SpO2 96%   BMI 65.41 kg/m   Physical Exam Vitals and nursing note reviewed.  Constitutional:      General: He is not in acute distress.    Appearance: He is well-developed.  HENT:     Head: Normocephalic and atraumatic.  Eyes:     Conjunctiva/sclera: Conjunctivae normal.     Pupils: Pupils are equal, round, and reactive to light.  Cardiovascular:     Rate and Rhythm: Normal rate and regular rhythm.     Heart sounds: No murmur heard.   Pulmonary:     Effort: Pulmonary effort is normal. No respiratory distress.     Breath sounds: Normal breath sounds. No wheezing or rales.  Abdominal:     General: There is no distension.     Palpations: Abdomen is soft.     Tenderness: There is no abdominal tenderness. There is no guarding or rebound.  Musculoskeletal:        General: No tenderness. Normal range of motion.     Cervical back: Normal range of motion and neck supple.     Right lower leg: Edema present.     Left lower leg: Edema present.     Comments: Bilateral lower extremity edema, chronic skin changes in bilateral lower extremities.  Several areas of crusting and oozing without significant pain or warmth.  No deep wounds present.  Skin:    General: Skin is warm and dry.     Findings: No erythema or rash.  Neurological:     Mental Status: He is alert and oriented to person, place, and time. Mental status is at baseline.  Psychiatric:        Attention and Perception: Attention normal.        Mood and Affect: Affect is labile and angry.        Speech:  Speech is rapid and pressured and tangential.        Behavior: Behavior is hyperactive.        Thought Content: Thought content includes suicidal ideation. Thought content includes suicidal plan.        Judgment: Judgment is impulsive and inappropriate.     ED Results / Procedures / Treatments   Labs (all labs ordered are listed,  but only abnormal results are displayed) Labs Reviewed  CBC - Abnormal; Notable for the following components:      Result Value   Hemoglobin 11.5 (*)    MCHC 29.4 (*)    All other components within normal limits  BASIC METABOLIC PANEL - Abnormal; Notable for the following components:   Glucose, Bld 115 (*)    Calcium 8.5 (*)    All other components within normal limits  ETHANOL  ACETAMINOPHEN LEVEL  SALICYLATE LEVEL  CBG MONITORING, ED    EKG None  Radiology No results found.  Procedures Procedures (including critical care time)  Medications Ordered in ED Medications  cephALEXin (KEFLEX) capsule 500 mg (has no administration in time range)    ED Course  I have reviewed the triage vital signs and the nursing notes.  Pertinent labs & imaging results that were available during my care of the patient were reviewed by me and considered in my medical decision making (see chart for details).    MDM Rules/Calculators/A&P                          44 year old male presenting again today after being seen 6 times in the last 5 days due to being sent here by [employer] for ongoing wounds of his legs.  These wounds appear more chronic.  They are not significantly warm they are bilateral but crusting and mild oozing.  Patient is afebrile has a normal white count and has no indication for admission.  He is vascularly intact.  However also patient reports he became angry in the waiting room and overdosed on medication.  However patient will not specify which medication he took.  His belongings were taken from him to assure his safety. This was over an  hour ago and pt is not bradycardic at this time. He is currently reporting suicidal and homicidal ideation.  However patient was seen 2 days ago for similar reports and stated he just pulled that card to get a ride back to [employer].  However he continues to report that he suicidal even after being given something to eat and drink.  Will discuss with social work about wound care options.  Patient's legs were wrapped and he was given a dose of his Keflex.  We will have TTS evaluate.   Final Clinical Impression(s) / ED Diagnoses Final diagnoses:  None    Rx / DC Orders ED Discharge Orders    None       Gwyneth Sprout, MD 01/27/20 2317

## 2020-01-27 NOTE — ED Notes (Signed)
Belongings and other medication taken from patient and put in labeled bag in triage.

## 2020-01-27 NOTE — ED Triage Notes (Signed)
Pt arrives to ED via [facility] w/ c/o bilat leg pain and swelling x several months. Pt has hx of cellulitis. Pt seen several times prior for same thing, prescribed abx but has been unable to pickup medications. Pt AOx4, VSS.

## 2020-01-27 NOTE — ED Provider Notes (Signed)
Care assumed from Dr. Anitra Lauth, patient possibly having overdosed on Coreg, but with normal heart rate.  Expressing suicidal thoughts but no plan.  Care assumed with ECG and labs pending, TTS consult pending.  ECG is normal. Labs are unremarkable. TTS consultation is pending.  TTS consult is appreciated.  Patient will be held in the emergency department overnight for psychiatric reevaluation in the morning.  Results for orders placed or performed during the hospital encounter of 01/27/20  CBC  Result Value Ref Range   WBC 8.0 4.0 - 10.5 K/uL   RBC 4.27 4.22 - 5.81 MIL/uL   Hemoglobin 11.5 (L) 13.0 - 17.0 g/dL   HCT 24.5 39 - 52 %   MCV 91.6 80.0 - 100.0 fL   MCH 26.9 26.0 - 34.0 pg   MCHC 29.4 (L) 30.0 - 36.0 g/dL   RDW 80.9 98.3 - 38.2 %   Platelets 213 150 - 400 K/uL   nRBC 0.0 0.0 - 0.2 %  Basic metabolic panel  Result Value Ref Range   Sodium 139 135 - 145 mmol/L   Potassium 4.1 3.5 - 5.1 mmol/L   Chloride 104 98 - 111 mmol/L   CO2 24 22 - 32 mmol/L   Glucose, Bld 115 (H) 70 - 99 mg/dL   BUN 11 6 - 20 mg/dL   Creatinine, Ser 5.05 0.61 - 1.24 mg/dL   Calcium 8.5 (L) 8.9 - 10.3 mg/dL   GFR calc non Af Amer >60 >60 mL/min   GFR calc Af Amer >60 >60 mL/min   Anion gap 11 5 - 15  Ethanol  Result Value Ref Range   Alcohol, Ethyl (B) <10 <10 mg/dL  Acetaminophen level  Result Value Ref Range   Acetaminophen (Tylenol), Serum <10 (L) 10 - 30 ug/mL  Salicylate level  Result Value Ref Range   Salicylate Lvl <7.0 (L) 7.0 - 30.0 mg/dL  CBG monitoring, ED  Result Value Ref Range   Glucose-Capillary 96 70 - 99 mg/dL    ECG interpretation: ECG shows normal sinus rhythm with a rate of 80, no ectopy. Normal axis. Normal P wave. Normal QRS. Normal intervals. Normal ST and T waves. Impression: normal ECG.  Compared with ECG of 01/24/2020, no significant changes are seen.   Dione Booze, MD 01/28/20 [id number]

## 2020-01-27 NOTE — ED Notes (Signed)
Volunteer in lobby alerted this RN that pt swallowed "a bunch" of his coreg in a suicide attempt while sitting in the lobby and does not care if he lives or dies.

## 2020-01-27 NOTE — Progress Notes (Signed)
   01/27/20 2325  TOC ED Mini Assessment  TOC Time spent with patient (minutes): 15  PING Used in TOC Assessment No  Admission or Readmission Diverted No  What brought you to the Emergency Department?  Suicidal Ideations/ homelessness  ED CM received verbal consult from Dr. Anitra Lauth concerning patient needing TOC assistance. Patient is homeless and verbalized SI/HI patient is waiting on TTS evaluation. CM attempted to speak to patient who stated he wants to speak to a psychiatrist at this time. CM explained that he will speak with [facility] as soon as they are available. Patient verbalized understanding, patient is located in H11 bed in front of the ED Nsg Station. EDP was updated TOC will follow up as needed once TTS is completed.

## 2020-01-28 DIAGNOSIS — Z5900 Homelessness unspecified: Secondary | ICD-10-CM

## 2020-01-28 DIAGNOSIS — F332 Major depressive disorder, recurrent severe without psychotic features: Secondary | ICD-10-CM

## 2020-01-28 NOTE — ED Notes (Signed)
Pt moved to RM14 for TTS. TTS is currently in process.

## 2020-01-28 NOTE — Discharge Instructions (Signed)
[facility] [address], [place], [place] [address] [phone]  [facility] [address], [place], [place] [address] [phone]

## 2020-01-28 NOTE — ED Notes (Signed)
Case manager at bedside. Patient provided sandwich bag and juice per his request.

## 2020-01-28 NOTE — ED Provider Notes (Signed)
I spoke to psych services, they believe patient is suited for outpatient therapy and not requiring inpatient services.  I reached out to SW about the patient's disposition issues.  They reported he has been seen by their services multiple times and given all available resources.  We will discharge on Keflex.   Terald Sleeper, MD 01/28/20 [phone]

## 2020-01-28 NOTE — ED Notes (Signed)
Lunch tray at bedside. ?

## 2020-01-28 NOTE — ED Notes (Signed)
Clayton Cameron with Social work advised they do not have anymore resources to provide patient since he has been seen multiple times and given multiple cab vouchers and that resources for places to stay are limited due to patient's age and comorbidities. Will make EDP aware.

## 2020-01-28 NOTE — Consult Note (Signed)
Telepsych Consultation   Reason for Consult: Suicidal ideations Referring Physician: Emergency department physician Location of Patient: [facility] Location of Provider: [facility]  Patient Identification: Clayton Cameron MRN:  [id number] Principal Diagnosis: MDD (major depressive disorder), recurrent severe, without psychosis (HCC) Diagnosis:  Principal Problem:   MDD (major depressive disorder), recurrent severe, without psychosis (HCC) Active Problems:   Homelessness   Total Time spent with patient: 20 minutes  Subjective:   Clayton Cameron is a 44 y.o. male patient admitted with suicidal ideations.  Patient states "I got angry and wanted to take an overdose of my medications."  Patient states "I need to find a homeless shelter that meets my needs or go to an assisted living facility."    HPI:  Patient assessed by nurse practitioner.  Patient alert and oriented, answers appropriately.  Patient pleasant cooperative with assessment.  Patient endorses chronic suicidal ideations times approximately 3 months.  Patient states "I am suicidal because I am constantly in pain."  Patient does not report any history of prior suicide attempts, denies self-harm behaviors.  Patient denies homicidal ideations currently.  Patient denies auditory visual hallucinations.  Patient does not appear to be responding to internal stimuli and there is no evidence of delusional thought content.  Patient denies symptoms of paranoia.  Patient reports he has been diagnosed with "clinical depression" versus bipolar disorder in the past.  Patient reports medications that have worked well to treat his depression include Abilify and Depakote.  Discussed plan to restart home medications including Abilify and Depakote, patient agrees with this plan.   Patient reports mostly recently resided at [facility] homeless shelter times approximately 2 weeks.  Patient reports  prior to coming to [place] he was homeless in [place].  Patient reports "I have been going from state to state because I have a warrant out in [place] and I do not want to go back there."  Patient reports he was a resident in an assisted living facility in the state of [place] approximately 2 years ago.  Patient reports interest in returning to assisted living facility.  Patient reports he is currently homeless as he is unable to return to [employer].  Patient denies access to weapons.  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  Patient denies alcohol use.  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  Patient denies any person to contact for collateral information at this time.  Past Psychiatric History: Major depressive disorder  Risk to Self:   Passive, chronic suicidal ideations Risk to Others:   Denies Prior Inpatient Therapy:   Patient reports multiple inpatient hospitalizations. Prior Outpatient Therapy:  Patient reports he is followed by outpatient psychiatrist in [name], unable to recall name of psychiatrist  Past Medical History:  Past Medical History:  Diagnosis Date  . CHF (congestive heart failure) (HCC)   . MRSA (methicillin resistant Staphylococcus aureus)     Past Surgical History:  Procedure Laterality Date  . CHOLECYSTECTOMY    . LAPAROSCOPIC GASTRIC BANDING    . LAPAROSCOPIC REPAIR AND REMOVAL OF GASTRIC BAND     Family History: No family history on file. Family Psychiatric  History: None reported  social History:  Social History   Substance and Sexual Activity  Alcohol Use Never     Social History   Substance and Sexual Activity  Drug Use Never    Social History  Socioeconomic History  . Marital status: Single    Spouse name: Not on file  . Number of children: Not on file  . Years of education: Not on file  . Highest  education level: Not on file  Occupational History  . Not on file  Tobacco Use  . Smoking status: Current Every Day Smoker    Packs/day: 1.00  . Smokeless tobacco: Never Used  Vaping Use  . Vaping Use: Never used  Substance and Sexual Activity  . Alcohol use: Never  . Drug use: Never  . Sexual activity: Not on file  Other Topics Concern  . Not on file  Social History Narrative  . Not on file   Social Determinants of Health   Financial Resource Strain:   . Difficulty of Paying Living Expenses: Not on file  Food Insecurity:   . Worried About Programme researcher, broadcasting/film/videounning Out of Food in the Last Year: Not on file  . Ran Out of Food in the Last Year: Not on file  Transportation Needs:   . Lack of Transportation (Medical): Not on file  . Lack of Transportation (Non-Medical): Not on file  Physical Activity:   . Days of Exercise per Week: Not on file  . Minutes of Exercise per Session: Not on file  Stress:   . Feeling of Stress : Not on file  Social Connections:   . Frequency of Communication with Friends and Family: Not on file  . Frequency of Social Gatherings with Friends and Family: Not on file  . Attends Religious Services: Not on file  . Active Member of Clubs or Organizations: Not on file  . Attends BankerClub or Organization Meetings: Not on file  . Marital Status: Not on file   Additional Social History:    Allergies:   Allergies  Allergen Reactions  . Augmentin [Amoxicillin-Pot Clavulanate] Diarrhea  . Chantix [Varenicline] Other (See Comments)    Weird dreams  . Codeine Itching  . Geodon [Ziprasidone] Other (See Comments)    Bronchitis   . Haldol [Haloperidol] Other (See Comments)    Aggression   . Morphine And Related Itching  . Oxycodone Itching  . Tyloxapol Itching    Labs:  Results for orders placed or performed during the hospital encounter of 01/27/20 (from the past 48 hour(s))  CBC     Status: Abnormal   Collection Time: 01/27/20  1:57 PM  Result Value Ref Range   WBC  8.0 4.0 - 10.5 K/uL   RBC 4.27 4.22 - 5.81 MIL/uL   Hemoglobin 11.5 (L) 13.0 - 17.0 g/dL   HCT 16.139.1 39 - 52 %   MCV 91.6 80.0 - 100.0 fL   MCH 26.9 26.0 - 34.0 pg   MCHC 29.4 (L) 30.0 - 36.0 g/dL   RDW 09.614.8 04.511.5 - 40.915.5 %   Platelets 213 150 - 400 K/uL   nRBC 0.0 0.0 - 0.2 %    Comment: Performed at [facility], [address] [place], [place] [id number]  Basic metabolic panel     Status: Abnormal   Collection Time: 01/27/20  1:57 PM  Result Value Ref Range   Sodium 139 135 - 145 mmol/L   Potassium 4.1 3.5 - 5.1 mmol/L    Comment: SLIGHT HEMOLYSIS   Chloride 104 98 - 111 mmol/L   CO2 24 22 - 32 mmol/L   Glucose, Bld 115 (H) 70 - 99 mg/dL    Comment: Glucose reference range applies only to samples taken after fasting for  at least 8 hours.   BUN 11 6 - 20 mg/dL   Creatinine, Ser 1.61 0.61 - 1.24 mg/dL   Calcium 8.5 (L) 8.9 - 10.3 mg/dL   GFR calc non Af Amer >60 >60 mL/min   GFR calc Af Amer >60 >60 mL/min   Anion gap 11 5 - 15    Comment: Performed at [facility], [address] [place], [place] [address]  CBG monitoring, ED     Status: None   Collection Time: 01/27/20  8:22 PM  Result Value Ref Range   Glucose-Capillary 96 70 - 99 mg/dL    Comment: Glucose reference range applies only to samples taken after fasting for at least 8 hours.  Ethanol     Status: None   Collection Time: 01/27/20  9:56 PM  Result Value Ref Range   Alcohol, Ethyl (B) <10 <10 mg/dL    Comment: (NOTE) Lowest detectable limit for serum alcohol is 10 mg/dL.  For medical purposes only. Performed at [facility], [address], [place], [place] [address]   Acetaminophen level     Status: Abnormal   Collection Time: 01/27/20  9:56 PM  Result Value Ref Range   Acetaminophen (Tylenol), Serum <10 (L) 10 - 30 ug/mL    Comment: (NOTE) Therapeutic concentrations vary significantly. A range of 10-30 ug/mL  may be an effective concentration for many patients. However, some  are best  treated at concentrations outside of this range. Acetaminophen concentrations >150 ug/mL at 4 hours after ingestion  and >50 ug/mL at 12 hours after ingestion are often associated with  toxic reactions.  Performed at [facility], [address] [place], [place] [address]   Salicylate level     Status: Abnormal   Collection Time: 01/27/20  9:56 PM  Result Value Ref Range   Salicylate Lvl <7.0 (L) 7.0 - 30.0 mg/dL    Comment: Performed at [facility], [address] [place], [place] [address]    Medications:  No current facility-administered medications for this encounter.   Current Outpatient Medications  Medication Sig Dispense Refill  . albuterol (VENTOLIN HFA) 108 (90 Base) MCG/ACT inhaler Inhale 2 puffs into the lungs every 6 (six) hours as needed for wheezing or shortness of breath.    Marland Kitchen apixaban (ELIQUIS) 5 MG TABS tablet Take 1 tablet (5 mg total) by mouth 2 (two) times daily. 60 tablet 1  . atorvastatin (LIPITOR) 20 MG tablet Take 1 tablet (20 mg total) by mouth daily. 30 tablet 1  . carvedilol (COREG) 3.125 MG tablet Take 1 tablet (3.125 mg total) by mouth 2 (two) times daily with a meal. 60 tablet 1  . cephALEXin (KEFLEX) 500 MG capsule Take 1 capsule (500 mg total) by mouth 3 (three) times daily. 15 capsule 0  . furosemide (LASIX) 20 MG tablet Take 1 tablet (20 mg total) by mouth 2 (two) times daily. 60 tablet 0  . Insulin Pen Needle (NOVOFINE) 32G X 6 MM MISC 1 Units by Does not apply route daily. 100 each 2  . liraglutide (VICTOZA) 18 MG/3ML SOPN Inject 0.2 mLs (1.2 mg total) into the skin daily. 3 mL 3  . lisinopril (ZESTRIL) 20 MG tablet Take 1 tablet (20 mg total) by mouth daily. 30 tablet 1  . naproxen sodium (ALEVE) 220 MG tablet Take 440 mg by mouth every 8 (eight) hours as needed (pain).    . potassium chloride (KLOR-CON) 10 MEQ tablet Take 1  tablet (10 mEq total) by mouth daily. 30 tablet 0  . potassium chloride SA (KLOR-CON) 20 MEQ tablet Take 1  tablet (20 mEq total) by mouth daily. 30 tablet 0  . triamcinolone ointment (KENALOG) 0.5 % Apply 1 application topically 2 (two) times daily for 7 days. 14 g 0    Musculoskeletal: Strength & Muscle Tone: Unable to assess Gait & Station: Unable to assess Patient leans: N/A  Psychiatric Specialty Exam: Physical Exam Vitals and nursing note reviewed.  Constitutional:      Appearance: He is well-developed. He is obese.  HENT:     Head: Normocephalic.     Ears:     Comments: Reports hearing difficulty, completed assessment using telephone per patient's request  Cardiovascular:     Rate and Rhythm: Normal rate.  Pulmonary:     Effort: Pulmonary effort is normal.  Neurological:     Mental Status: He is alert and oriented to person, place, and time.  Psychiatric:        Attention and Perception: Attention and perception normal.        Mood and Affect: Affect normal. Mood is anxious.        Speech: Speech normal.        Behavior: Behavior normal. Behavior is cooperative.        Thought Content: Thought content includes suicidal ideation.        Cognition and Memory: Cognition and memory normal.        Judgment: Judgment normal.     Review of Systems  Constitutional: Negative.   HENT: Negative.   Eyes: Negative.   Respiratory: Negative.   Cardiovascular: Negative.   Gastrointestinal: Negative.   Genitourinary: Negative.   Musculoskeletal: Negative.   Skin: Negative.   Neurological: Negative.   Psychiatric/Behavioral: The patient is nervous/anxious.     Blood pressure 107/64, pulse 72, temperature 97.9 F (36.6 C), temperature source Oral, resp. rate 20, height 5\' 11"  (1.803 m), weight (!) 212.7 kg, SpO2 97 %.Body mass index is 65.41 kg/m.  General Appearance: Casual  Eye Contact:  Good  Speech:  Clear and Coherent and Normal Rate  Volume:  Normal  Mood:  Anxious  Affect:  Appropriate and Congruent  Thought Process:  Coherent, Goal Directed and Descriptions of  Associations: Intact  Orientation:  Full (Time, Place, and Person)  Thought Content:  Logical  Suicidal Thoughts:  Yes.  without intent/plan  Homicidal Thoughts:  No  Memory:  Immediate;   Good Recent;   Good Remote;   Good  Judgement:  Fair  Insight:  Good  Psychomotor Activity:  Normal  Concentration:  Concentration: Good and Attention Span: Good  Recall:  Good  Fund of Knowledge:  Good  Language:  Good  Akathisia:  No  Handed:  Right  AIMS (if indicated):     Assets:  Communication Skills Desire for Improvement Financial Resources/Insurance Leisure Time Resilience  ADL's:  Unable to assess  Cognition:  WNL  Sleep:        Treatment Plan Summary: Patient reviewed with Dr. . Recommend restart home medications including: -Depakote 250mg  BID -Abilify 2.5 mg daily  Recommend consider social work consult to assist with disposition.  Disposition: No evidence of imminent risk to self or others at present.   Patient does not meet criteria for psychiatric inpatient admission. Supportive therapy provided about ongoing stressors.  This service was provided via telemedicine using a 2-way, interactive audio and video technology.  Names of all persons participating in  this telemedicine service and their role in this encounter. Name: Samyak Sackmann Role: Patient  Name: Berneice Heinrich Role: FNP  Name: Dr. Lucianne Muss Role: Psychiatrist    Patrcia Dolly, FNP 01/28/2020 11:42 AM

## 2020-01-28 NOTE — Progress Notes (Signed)
CSW received consult for patient for housing resources. CSW familiar with patient as he has frequented the [employer] for the past week. TOC CSW's and RN CM's have provided the patient with abundant resources including cab vouchers, medication assistance, and information about permanent housing options. Patient reported to TTS NP that he receives SSI income. There are no [facility] available at [facility] or [facility] in [place]. The patient will not qualify for ALF without a long term payor source. The patient is actively using illegal substances.  CSW spoke with Leeroy Bock, RN regarding the lack of additional supports for this patient.   CSW added information for [facility] to the patient's AVS.  Edwin Dada, MSW, LCSW-A Transitions of Care   Clinical Social Worker  [facility]   Medical ICU [phone]

## 2020-01-28 NOTE — ED Notes (Signed)
Discharge instructions discussed with pt. Pt verbalized understanding with no questions at this time. Requested bus pass for transportation home which was provided. Pt signed belongings inventory sheet when items were returned. Verbalizing he had all belongings returned. Pt ambulatory at discharge requese\ted rn to wrap legs which was provided prior to departure/

## 2020-01-28 NOTE — ED Notes (Signed)
TTS assessment completed. Pt requesting lunch tray, ED secretary to order.

## 2020-01-28 NOTE — ED Notes (Signed)
Patient placed in room to do telepsych per [facility] request

## 2020-01-28 NOTE — BH Assessment (Signed)
Comprehensive Clinical Assessment (CCA) Screening, Triage and Referral Note  01/28/2020 Clayton Cameron [id number]    Clayton Cameron is a 44 year old male who presents to [facility] voluntarily for overdose on his medications. Per EDP report, "  Patient reports he became so angry at Clayton Cameron and while waiting in the waiting room that he decided to overdose on his medication.  He is told the nurse that he overdosed on Coreg however he will not tell me a specific medication he overdosed on currently. During assessment pt states that he was so upset that he decided to overdose on medications. Pt states he is currently SI and HI towards [facility] because they would not assist him. Pt denies AVH and SIB. Pt reports he just wants help with medical issues but per EDP they have addressed his medical issues and feels he does not need to be medically admitted currently.  Per chart pt he has history of MRSA, chronic venous stasis bilaterally, CHF, homelessness, bipolar disease who recently came to live in [place] and has now visited the emergency room 6 times in the last 5 days. Pt currently states he no longer wishes to return to Clayton Cameron and would like to placed somewhere else.   Disposition: Clayton Paddy, FNP recommends pt for overnight observation, reassess in the morning. Per NP no appropriate beds for pt at this time due to medical history.  Diagnosis: Homelessness   Visit Diagnosis: suicidal  Patient Reported Information How did you hear about [place]? Self   Referral name: Patient presented via [facility] staff transport.   Referral phone number: No data recorded Whom do you see for routine medical problems? Hospital ER   Practice/Facility Name: [facility]   Practice/Facility Phone Number: No data recorded  Name of Contact: No data recorded  Contact Number: No data recorded  Contact Fax Number: No data recorded  Prescriber Name: No data recorded  Prescriber  Address (if known): No data recorded What Is the Reason for Your Visit/Call Today? 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  He has passive SI, however mostly focuses on meeding a place to stay.  How Long Has This Been Causing You Problems? 1 wk - 1 month  Have You Recently Been in Any Inpatient Treatment (Hospital/Detox/Crisis Center/28-Day Program)? No   Name/Location of Program/Hospital:No data recorded  How Long Were You There? No data recorded  When Were You Discharged? No data recorded Have You Ever Received Services From [facility] Before? No   Who Do You See at [facility]? No data recorded Have You Recently Had Any Thoughts About Hurting Yourself? Yes   Are You Planning to Commit Suicide/Harm Yourself At This time?  Yes  Have you Recently Had Thoughts About Hurting Someone Clayton Cameron? Yes   Explanation: No data recorded Have You Used Any Alcohol or Drugs in the Past 24 Hours? No   How Long Ago Did You Use Drugs or Alcohol?  No data recorded  What Did You Use and How Much? Alcohol - pt reports drinking a case of beer over the past 2 days  What Do You Feel Would Help You the Most Today? Assessment Only  Do You Currently Have a Therapist/Psychiatrist? No   Name of Therapist/Psychiatrist: No data recorded  Have You Been Recently Discharged From Any Office Practice or Programs? No   Explanation of Discharge From Practice/Program:  No data recorded    CCA Screening Triage Referral Assessment Type of  Contact: Tele-Assessment   Is this Initial or Reassessment? Initial Assessment   Date Telepsych consult ordered in [facility]:  01/25/20   Time Telepsych consult ordered in [facility]:  0423  Patient Reported Information Reviewed? Yes   Patient Left Without Being Seen? No   Reason for Not Completing Assessment: No data recorded Collateral Involvement: none  Does Patient Have a Court Appointed Legal Guardian? No data recorded  Name and  Contact of Legal Guardian:  No data recorded If Minor and Not Living with Parent(s), Who has Custody? N/A  Is CPS involved or ever been involved? Never  Is APS involved or ever been involved? Never  Patient Determined To Be At Risk for Harm To Self or Others Based on Review of Patient Reported Information or Presenting Complaint? No   Method: No data recorded  Availability of Means: No data recorded  Intent: No data recorded  Notification Required: No data recorded  Additional Information for Danger to Others Potential:  No data recorded  Additional Comments for Danger to Others Potential:  No data recorded  Are There Guns or Other Weapons in Your Home?  No data recorded   Types of Guns/Weapons: No data recorded   Are These Weapons Safely Secured?                              No data recorded   Who Could Verify You Are Able To Have These Secured:    No data recorded Do You Have any Outstanding Charges, Pending Court Dates, Parole/Probation? No data recorded Contacted To Inform of Risk of Harm To Self or Others: Other: Comment (NA)  Location of Assessment: [facility] ED  Does Patient Present under Involuntary Commitment? No   IVC Papers Initial File Date: No data recorded  [place] of Residence: [place]  Patient Currently Receiving the Following Services: Not Receiving Services   Determination of Need: Emergent (2 hours)   Options For Referral: Observe Overnight  Clayton Cameron, LCSWA

## 2020-01-28 NOTE — ED Notes (Signed)
Pt stating he did not get his lunch tray, [place] sandwich bag provided.

## 2020-02-13 NOTE — Telephone Encounter (Signed)
See notes

## 2021-02-21 IMAGING — CR DG CHEST 2V
2 series · 2 of 2 positions shown · non-contrast
Comparison: 01/24/2020 at [DATE] a.m.

CLINICAL DATA: SOB and bilateral LE edema today; hx CHF; smoker;
tech was not able to obtain better lat view. Best obtainable

EXAM:
CHEST - 2 VIEW

[w chest lat]
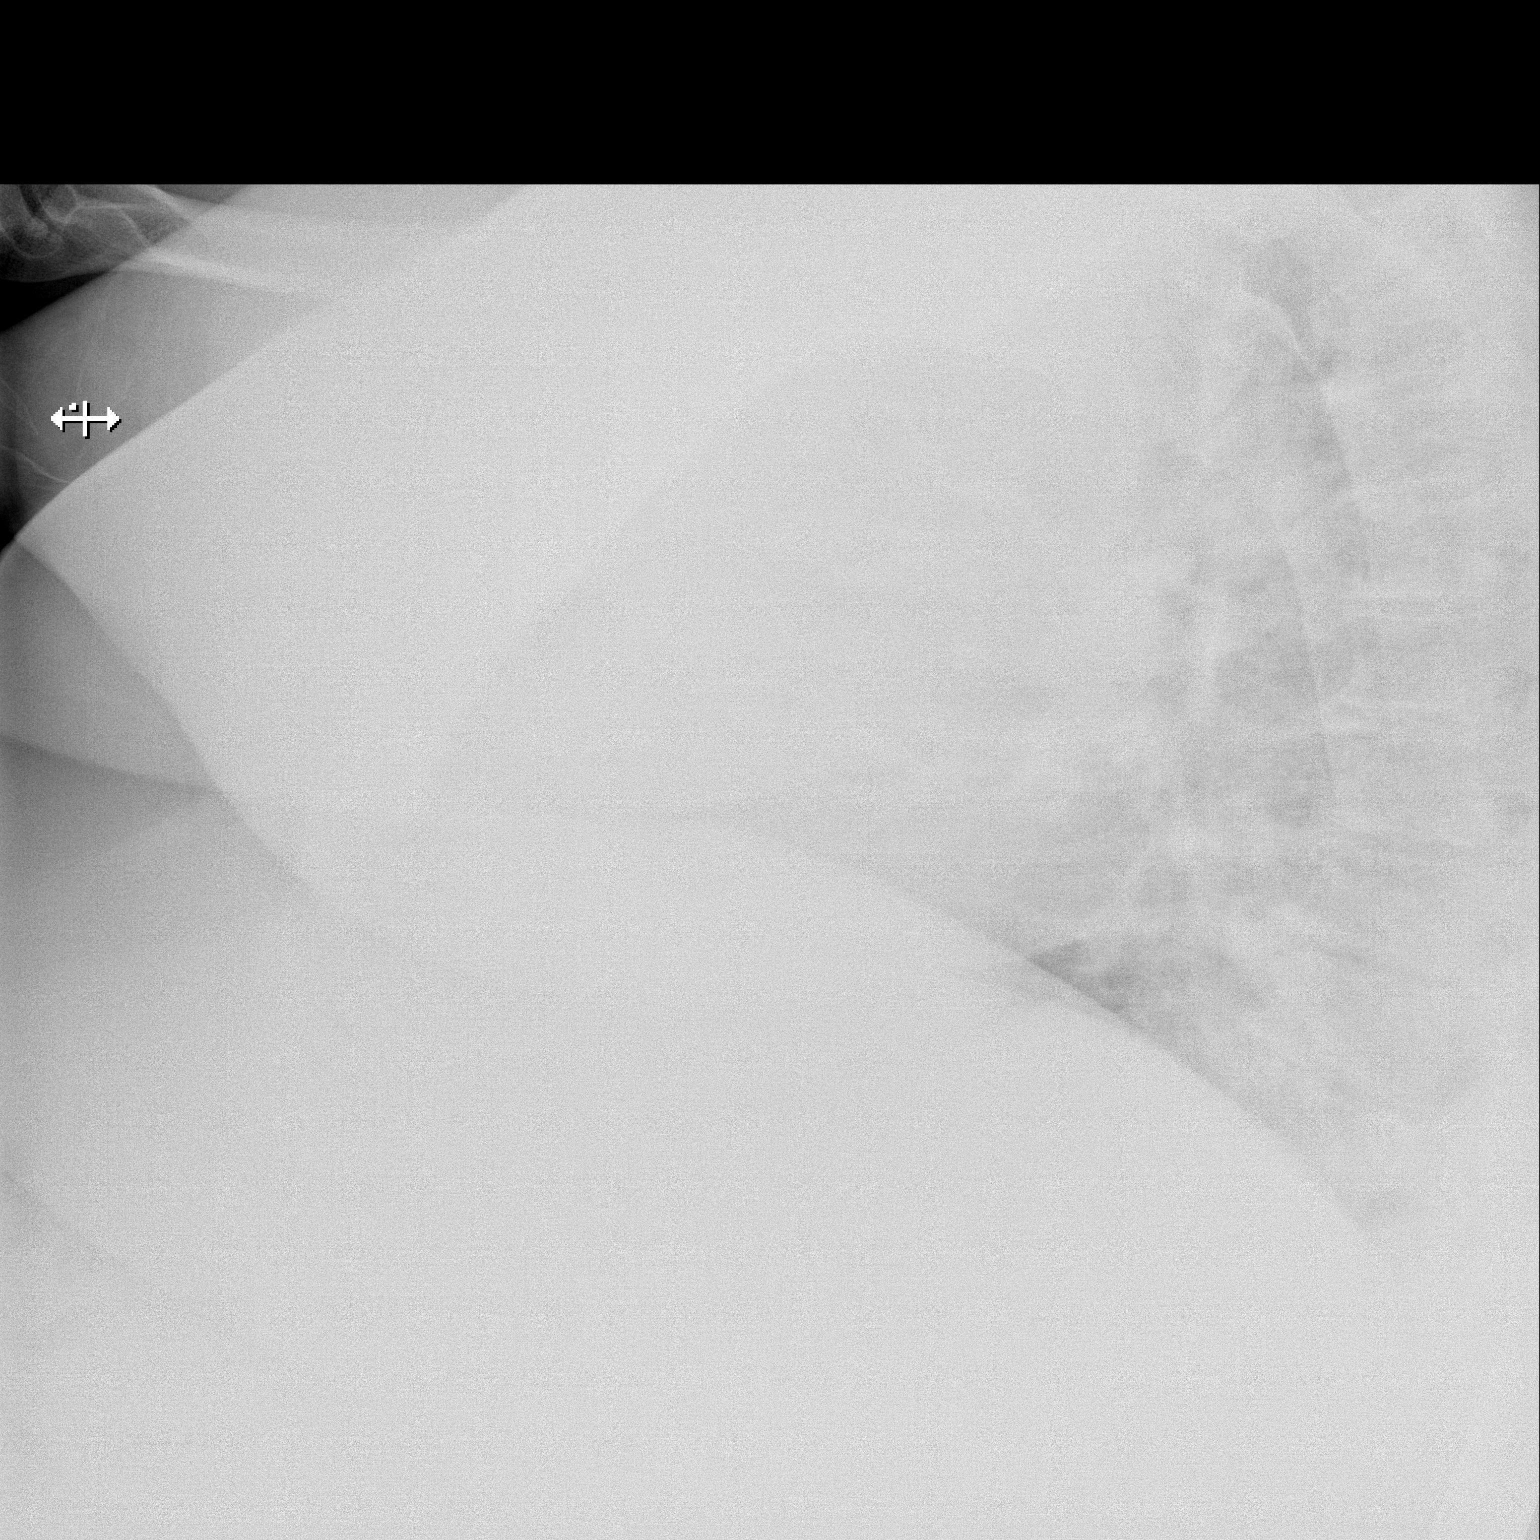

[x chest ap]
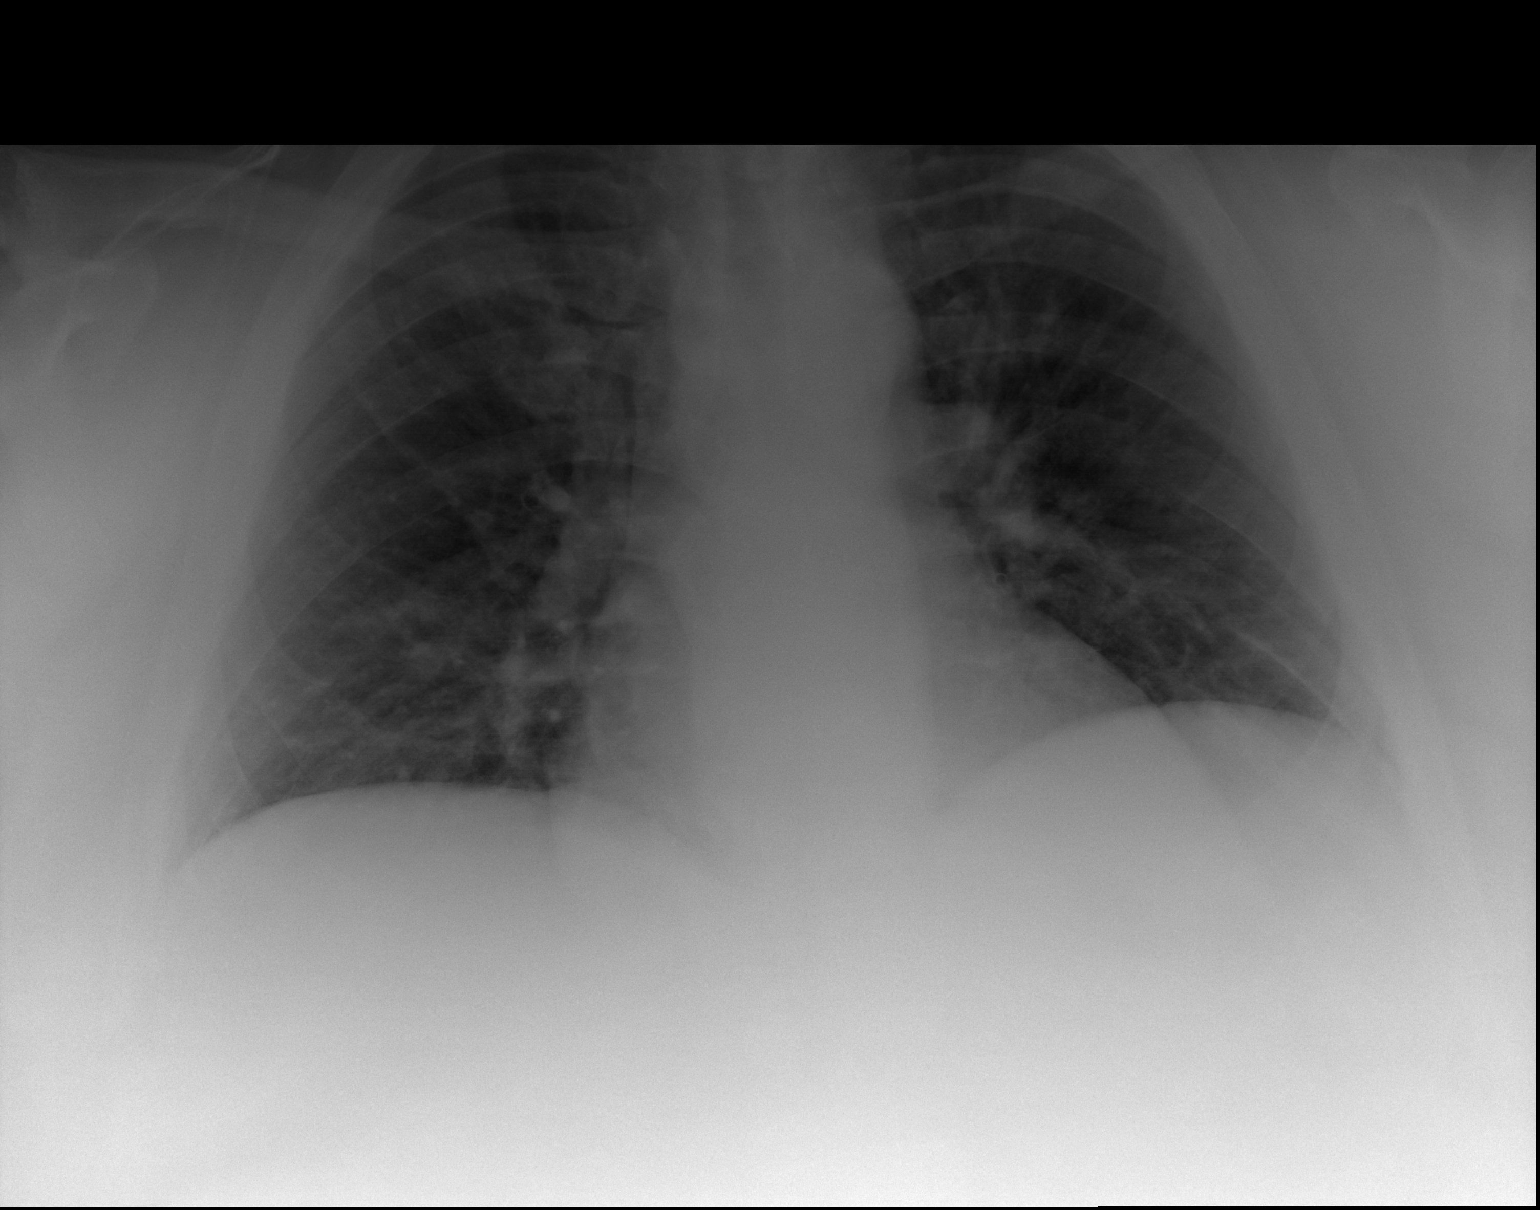

[2 of 2 positions shown; findings below may reference images not displayed]

FINDINGS: Cardiac silhouette normal in size.  No mediastinal or hilar masses.

Lungs are clear. No convincing pleural effusion and no pneumothorax.

Skeletal structures are grossly intact.
IMPRESSION: No active cardiopulmonary disease.
